# Patient Record
Sex: Male | Born: 1970 | Race: White | Hispanic: No | Marital: Single | Smoking: Former smoker
Health system: Southern US, Community
[De-identification: ages and names within clinical notes are randomized; demographics above are authoritative.]

## PROBLEM LIST (undated history)

## (undated) DIAGNOSIS — Q7133 Congenital absence of hand and finger, bilateral: Secondary | ICD-10-CM

## (undated) DIAGNOSIS — T7840XA Allergy, unspecified, initial encounter: Secondary | ICD-10-CM

## (undated) DIAGNOSIS — M792 Neuralgia and neuritis, unspecified: Secondary | ICD-10-CM

## (undated) DIAGNOSIS — J302 Other seasonal allergic rhinitis: Secondary | ICD-10-CM

## (undated) DIAGNOSIS — E785 Hyperlipidemia, unspecified: Secondary | ICD-10-CM

## (undated) DIAGNOSIS — Q7163 Lobster-claw hand, bilateral: Secondary | ICD-10-CM

## (undated) HISTORY — DX: Other seasonal allergic rhinitis: J30.2

## (undated) HISTORY — DX: Hyperlipidemia, unspecified: E78.5

## (undated) HISTORY — PX: CERVICAL DISC SURGERY: SHX588

## (undated) HISTORY — DX: Lobster-claw hand, bilateral: Q71.63

## (undated) HISTORY — DX: Neuralgia and neuritis, unspecified: M79.2

## (undated) HISTORY — DX: Congenital absence of hand and finger, bilateral: Q71.33

## (undated) HISTORY — DX: Allergy, unspecified, initial encounter: T78.40XA

---

## 2014-07-04 ENCOUNTER — Ambulatory Visit (INDEPENDENT_AMBULATORY_CARE_PROVIDER_SITE_OTHER): Payer: PRIVATE HEALTH INSURANCE | Admitting: Neurology

## 2014-07-04 ENCOUNTER — Encounter: Payer: Self-pay | Admitting: Neurology

## 2014-07-04 VITALS — BP 128/84 | HR 88 | Ht 72.0 in | Wt 222.1 lb

## 2014-07-04 DIAGNOSIS — R258 Other abnormal involuntary movements: Secondary | ICD-10-CM | POA: Diagnosis not present

## 2014-07-04 DIAGNOSIS — R292 Abnormal reflex: Secondary | ICD-10-CM | POA: Diagnosis not present

## 2014-07-04 DIAGNOSIS — G959 Disease of spinal cord, unspecified: Secondary | ICD-10-CM | POA: Insufficient documentation

## 2014-07-04 DIAGNOSIS — M62838 Other muscle spasm: Secondary | ICD-10-CM

## 2014-07-04 MED ORDER — BACLOFEN 10 MG PO TABS: 10.0000 mg | ORAL_TABLET | Freq: Three times a day (TID) | ORAL | Status: DC

## 2014-07-04 NOTE — Progress Notes (Signed)
Note faxed.
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
## 2014-07-04 NOTE — Patient Instructions (Addendum)
1.  Start taking Baclofen 10 mg tablets for your muscle spasms and cramps     Morning       Afternoon        Evening   Day 1-5 1/2 tab                                1/2 tab               Day 6-10 1/2 tab         1/2 tab            1/2 tab               Day 11-15 1/2 tab         1/2 tab            1 tab                 Day 16-20 1 tab            1/2 tab            1 tab                 Continue 1 tab             1 tab              1 tab        2.  MRI of the cervical and thoracic spine wwo contrast  3.  We will be in touch regarding the results of your imaging and determine the next steps from there

## 2014-07-12 ENCOUNTER — Ambulatory Visit
Admission: RE | Admit: 2014-07-12 | Discharge: 2014-07-12 | Disposition: A | Discharge status: Home / Self Care | Payer: 59 | Source: Ambulatory Visit | Attending: Neurology | Admitting: Neurology

## 2014-07-12 ENCOUNTER — Telehealth: Payer: Self-pay | Admitting: Neurology

## 2014-07-12 DIAGNOSIS — R258 Other abnormal involuntary movements: Secondary | ICD-10-CM

## 2014-07-12 DIAGNOSIS — G959 Disease of spinal cord, unspecified: Secondary | ICD-10-CM

## 2014-07-12 DIAGNOSIS — R292 Abnormal reflex: Secondary | ICD-10-CM

## 2014-07-12 DIAGNOSIS — M62838 Other muscle spasm: Secondary | ICD-10-CM

## 2014-07-12 MED ORDER — GADOBENATE DIMEGLUMINE 529 MG/ML IV SOLN
20.0000 mL | Freq: Once | INTRAVENOUS | Status: AC | PRN
  Administered 2014-07-12: 20 mL via INTRAVENOUS
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
## 2014-07-12 NOTE — Telephone Encounter (Signed)
Referral faxed

## 2014-07-16 ENCOUNTER — Telehealth: Payer: Self-pay | Admitting: Neurology

## 2014-07-16 NOTE — Telephone Encounter (Signed)
Great.  Thanks

## 2014-07-16 NOTE — Telephone Encounter (Signed)
[facility] called back regarding patient's appt. Pt has bene rescheduled for this afternoon at 530pm with Dr. Sheryle Spray. Pt is aware of appt / Sherri S.

## 2014-07-16 NOTE — Telephone Encounter (Signed)
FYI

## 2016-03-16 ENCOUNTER — Encounter: Payer: Self-pay | Admitting: Physician Assistant

## 2016-03-16 ENCOUNTER — Ambulatory Visit (INDEPENDENT_AMBULATORY_CARE_PROVIDER_SITE_OTHER): Payer: BC Managed Care – PPO | Admitting: Physician Assistant

## 2016-03-16 VITALS — BP 138/84 | HR 87 | Temp 97.1°F | Ht 72.0 in | Wt 231.4 lb

## 2016-03-16 DIAGNOSIS — Z111 Encounter for screening for respiratory tuberculosis: Secondary | ICD-10-CM

## 2016-03-16 DIAGNOSIS — Q7133 Congenital absence of hand and finger, bilateral: Secondary | ICD-10-CM | POA: Diagnosis not present

## 2016-03-16 DIAGNOSIS — Z23 Encounter for immunization: Secondary | ICD-10-CM | POA: Diagnosis not present

## 2016-03-16 DIAGNOSIS — Z6831 Body mass index (BMI) 31.0-31.9, adult: Secondary | ICD-10-CM | POA: Diagnosis not present

## 2016-03-16 DIAGNOSIS — Q7163 Lobster-claw hand, bilateral: Secondary | ICD-10-CM | POA: Insufficient documentation

## 2016-03-16 DIAGNOSIS — G250 Essential tremor: Secondary | ICD-10-CM

## 2016-03-17 ENCOUNTER — Encounter: Payer: Self-pay | Admitting: Physician Assistant

## 2016-03-17 NOTE — Patient Instructions (Signed)
Essential Tremor A tremor is trembling or shaking that you cannot control. Most tremors affect the hands or arms. Tremors can also affect the head, vocal cords, face, and other parts of the body.  Essential tremor is a tremor without a known cause.  CAUSES Essential tremor has no known cause.  RISK FACTORS You may be at greater risk of essential tremor if:   You have a family member with essential tremor.   You are age 45 or older.   You take certain medicines. SIGNS AND SYMPTOMS The main sign of a tremor is uncontrolled and unintentional rhythmic shaking of a body part.  You may have difficulty eating with a spoon or fork.   You may have difficulty writing.   You may nod your head up and down or side to side.   You may have a quivering voice.  Your tremors:  May get worse over time.   May come and go.   May be more noticeable on one side of your body.   May get worse due to stress, fatigue, caffeine, and extreme heat or cold.  DIAGNOSIS Your health care provider can diagnose essential tremor based on your symptoms, medical history, and a physical examination. There is no single test to diagnose an essential tremor. However, your health care provider may perform a variety of tests to rule out other conditions. Tests may include:   Blood and urine tests.   Imaging studies of your brain, such as:   CT scan.   MRI.   A test that measures involuntary muscle movement (electromyogram). TREATMENT Your tremors may go away without treatment. Mild tremors may not need treatment if they do not affect your day-to-day life. Severe tremors may need to be treated using one or a combination of the following options:   Medicines. This may include medicine that is injected.  Lifestyle changes.   Physical therapy.  HOME CARE INSTRUCTIONS  Take medicines only as directed by your health care provider.   Limit alcohol intake to no more than 1 drink per day for  nonpregnant women and 2 drinks per day for men. One drink equals 12 oz of beer, 5 oz of wine, or 1 oz of hard liquor.  Do not use any tobacco products, including cigarettes, chewing tobacco, or electronic cigarettes. If you need help quitting, ask your health care provider.  Take medicines only as directed by your health care provider.   Avoid extreme heat or cold.   Limit the amount of caffeine you consumeas directed by your health care provider.   Try to get eight hours of sleep each night.  Find ways to manage your stress, such as meditation or yoga.  Keep all follow-up visits as directed by your health care provider. This is important. This includes any physical therapy visits. SEEK MEDICAL CARE IF:  You experience any changes in the location or intensity of your tremors.   You start having a tremor after starting a new medicine.   You have tremor with other symptoms such as:   Numbness.   Tingling.   Pain.   Weakness.   Your tremor gets worse.   Your tremor interferes with your daily life.    This information is not intended to replace advice given to you by your health care provider. Make sure you discuss any questions you have with your health care provider.   Document Released: 05/25/2014 Document Reviewed: 05/25/2014 Elsevier Interactive Patient Education 2016 Elsevier Inc.  

## 2016-03-17 NOTE — Progress Notes (Signed)
BP 138/84   Pulse 87   Temp 97.1 F (36.2 C) (Oral)   Ht 6' (1.829 m)   Wt 231 lb 6.4 oz (105 kg)   BMI 31.38 kg/m    Subjective:    Patient ID: Jeffrey Lopez, male    DOB: 1970-08-11, 45 y.o.   MRN: [id number]  Jeffrey Lopez is a 45 y.o. male presenting on 03/16/2016 for Tremors  HPI Patient here to be established as new patient at [facility].  This patient is known to me from [facility]. He has continued to have some mild amount of tremor even on the gabapentin. He is tolerating it very well. He is taking 300 mg at bedtime. He also needs a form completed for work with [employer]. 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 He is excited about this opportunity. All his medications are reviewed today. He has had a well exam performed this year.  Past Medical History:  Diagnosis Date  . Ectrodactyly of both hands    Congenital hand deformity   Relevant past medical, surgical, family and social history reviewed and updated as indicated. Interim medical history since our last visit reviewed. Allergies and medications reviewed and updated.   Data reviewed from any sources in EPIC.  Review of Systems  Constitutional: Negative.  Negative for appetite change and fatigue.  HENT: Negative.   Eyes: Negative.  Negative for pain and visual disturbance.  Respiratory: Negative.  Negative for cough, chest tightness, shortness of breath and wheezing.   Cardiovascular: Negative.  Negative for chest pain, palpitations and leg swelling.  Gastrointestinal: Negative.  Negative for abdominal pain, diarrhea, nausea and vomiting.  Endocrine: Negative.   Genitourinary: Negative.   Musculoskeletal: Negative.   Skin: Negative.  Negative for color change and rash.  Neurological: Positive for tremors. Negative for seizures, syncope, weakness, numbness and headaches.    Psychiatric/Behavioral: Negative.      Social History   Social History  . Marital status: Single    Spouse name: N/A  . Number of children: N/A  . Years of education: N/A   Occupational History  . Not on file.   Social History Main Topics  . Smoking status: Former Smoker    Packs/day: 1.00    Years: 20.00    Types: Cigarettes    Quit date: 07/04/2013  . Smokeless tobacco: Former Systems developer     Comment: on the patch  . Alcohol use 0.0 oz/week     Comment: Rarely  . Drug use: No  . Sexual activity: Not on file   Other Topics Concern  . Not on file   Social History Narrative   Lives with daughter in a 2 story home.     Works with a IT consultant.     Education: vocational school.     Past Surgical History:  Procedure Laterality Date  . none      Family History  Problem Relation Age of Onset  . Asthma Mother     Deceased, 50  . Heart attack Father     Deceased, 29  . Diabetes Paternal Aunt   . Healthy Sister   . Healthy Daughter       Medication List       Accurate as of 03/16/16 11:59 PM. Always use your most recent med list.  gabapentin 300 MG capsule Commonly known as:  NEURONTIN Take 300 mg by mouth daily.          Objective:    BP 138/84   Pulse 87   Temp 97.1 F (36.2 C) (Oral)   Ht 6' (1.829 m)   Wt 231 lb 6.4 oz (105 kg)   BMI 31.38 kg/m   No Known Allergies Wt Readings from Last 3 Encounters:  03/16/16 231 lb 6.4 oz (105 kg)  07/12/14 220 lb (99.8 kg)  07/12/14 220 lb (99.8 kg)    Physical Exam  Constitutional: He appears well-developed and well-nourished.  HENT:  Head: Normocephalic and atraumatic.  Eyes: Conjunctivae and EOM are normal. Pupils are equal, round, and reactive to light.  Neck: Normal range of motion. Neck supple.  Cardiovascular: Normal rate, regular rhythm and normal heart sounds.   Pulmonary/Chest: Effort normal and breath sounds normal.  Abdominal: Soft. Bowel sounds are normal.   Musculoskeletal: Normal range of motion.       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 He has good muscle tone in his hands. Good circulation skin and joints appear normal  Skin: Skin is warm and dry.       Assessment & Plan:   1. Tremor, essential - gabapentin (NEURONTIN) 300 MG capsule; Take 300 mg by mouth daily.  2. Ectrodactyly of both hands  3. Body mass index 31.0-31.9, adult  4. Screening-pulmonary TB - Quantiferon tb gold assay (blood)  5. Encounter for immunization - Flu vaccine, recombinat, quadrivalent, inj   Continue all other maintenance medications as listed above. Educational handout given for tremor  Follow up plan: Return if symptoms worsen or fail to improve.  Terald Sleeper PA-C [facility] [address]  [place], [place] [address] [phone]   03/17/2016, 2:20 PM

## 2016-03-18 LAB — QUANTIFERON IN TUBE
QUANTIFERON MITOGEN VALUE: 7.55 IU/mL
QUANTIFERON NIL VALUE: 0.02 [IU]/mL
QUANTIFERON TB AG VALUE: 0.01 [IU]/mL
QUANTIFERON TB GOLD: NEGATIVE

## 2016-03-18 LAB — QUANTIFERON TB GOLD ASSAY (BLOOD)

## 2016-07-28 ENCOUNTER — Ambulatory Visit (INDEPENDENT_AMBULATORY_CARE_PROVIDER_SITE_OTHER): Payer: BC Managed Care – PPO

## 2016-07-28 ENCOUNTER — Encounter: Payer: Self-pay | Admitting: Family Medicine

## 2016-07-28 ENCOUNTER — Ambulatory Visit (INDEPENDENT_AMBULATORY_CARE_PROVIDER_SITE_OTHER): Payer: BC Managed Care – PPO | Admitting: Family Medicine

## 2016-07-28 VITALS — BP 120/79 | HR 91 | Temp 98.1°F | Ht 72.0 in | Wt 229.0 lb

## 2016-07-28 DIAGNOSIS — R059 Cough, unspecified: Secondary | ICD-10-CM

## 2016-07-28 DIAGNOSIS — R05 Cough: Secondary | ICD-10-CM | POA: Diagnosis not present

## 2016-07-28 MED ORDER — BETAMETHASONE SOD PHOS & ACET 6 (3-3) MG/ML IJ SUSP
6.0000 mg | Freq: Once | INTRAMUSCULAR | Status: AC
  Administered 2016-07-28: 6 mg via INTRAMUSCULAR

## 2016-07-28 MED ORDER — FEXOFENADINE HCL 180 MG PO TABS: 180.0000 mg | ORAL_TABLET | Freq: Every day | ORAL | 11 refills | Status: DC

## 2016-07-28 NOTE — Progress Notes (Addendum)
Subjective:  Patient ID: Jeffrey Lopez, male    DOB: 1971/01/14  Age: 46 y.o. MRN: [id number]  CC: Cough   HPI Jeffrey Lopez presents for Patient presents with upper respiratory congestion. Rhinorrhea that is frequently purulent. There is moderate sore throat. Patient reports coughing frequently as well.  No sputum noted. There is no fever, chills or sweats. The patient denies being short of breath. Onset was 7 days ago. Gradually worsening. Tried OTCs without improvement. Occurs anually in the Spring. 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    History Jeffrey Lopez has a past medical history of Ectrodactyly of both hands.   Jeffrey Lopez has a past surgical history that includes none.   His family history includes Asthma in his mother; Diabetes in his paternal aunt; Healthy in his daughter and sister; Heart attack in his father.Jeffrey Lopez reports that Jeffrey Lopez quit smoking about 3 years ago. His smoking use included Cigarettes. Jeffrey Lopez has a 20.00 pack-year smoking history. Jeffrey Lopez has quit using smokeless tobacco. Jeffrey Lopez reports that Jeffrey Lopez drinks alcohol. Jeffrey Lopez reports that Jeffrey Lopez does not use drugs.    ROS Review of Systems  Constitutional: Negative for activity change, appetite change, chills and fever.  HENT: Positive for congestion, postnasal drip, rhinorrhea and sinus pressure. Negative for ear discharge, ear pain, hearing loss, nosebleeds, sneezing and trouble swallowing.   Respiratory: Negative for chest tightness and shortness of breath.   Cardiovascular: Negative for chest pain and palpitations.  Skin: Negative for rash.    Objective:  BP 120/79   Pulse 91   Temp 98.1 F (36.7 C) (Oral)   Ht 6' (1.829 m)   Wt 229 lb (103.9 kg)   BMI 31.06 kg/m   BP Readings from Last 3 Encounters:  07/28/16 120/79  03/16/16 138/84  07/04/14 128/84    Wt Readings from Last 3 Encounters:  07/28/16 229 lb (103.9 kg)  03/16/16 231 lb 6.4 oz (105 kg)  07/12/14 220 lb (99.8 kg)     Physical  Exam  Constitutional: Jeffrey Lopez appears well-developed and well-nourished.  HENT:  Head: Normocephalic and atraumatic.  Right Ear: Tympanic membrane and external ear normal. No decreased hearing is noted.  Left Ear: Tympanic membrane and external ear normal. No decreased hearing is noted.  Nose: Mucosal edema present. Right sinus exhibits no frontal sinus tenderness. Left sinus exhibits no frontal sinus tenderness.  Mouth/Throat: No oropharyngeal exudate or posterior oropharyngeal erythema.  Neck: No Brudzinski's sign noted.  Pulmonary/Chest: Breath sounds normal. No respiratory distress.  Lymphadenopathy:       Head (right side): No preauricular adenopathy present.       Head (left side): No preauricular adenopathy present.       Right cervical: No superficial cervical adenopathy present.      Left cervical: No superficial cervical adenopathy present.      Assessment & Plan:   Jeffrey Lopez was seen today for cough.  Diagnoses and all orders for this visit:  Cough -     DG Chest 2 View; Future -     betamethasone acetate-betamethasone sodium phosphate (CELESTONE) injection 6 mg; Inject 1 mL (6 mg total) into the muscle once.  Other orders -     fexofenadine (ALLEGRA) 180 MG tablet; Take 1 tablet (180 mg total) by mouth daily. For allergy symptoms     I am having Jeffrey Lopez start on fexofenadine. I am also having him maintain his gabapentin. We administered betamethasone acetate-betamethasone sodium phosphate.  Allergies as of  07/28/2016   No Known Allergies     Medication List       Accurate as of 07/28/16 11:59 PM. Always use your most recent med list.          fexofenadine 180 MG tablet Commonly known as:  ALLEGRA Take 1 tablet (180 mg total) by mouth daily. For allergy symptoms   gabapentin 300 MG capsule Commonly known as:  NEURONTIN Take 300 mg by mouth daily.       Follow-up: Return in about 2 weeks (around 08/11/2016).  Claretta Fraise, M.D.

## 2016-12-30 ENCOUNTER — Encounter: Payer: Self-pay | Admitting: Physician Assistant

## 2016-12-30 ENCOUNTER — Ambulatory Visit (INDEPENDENT_AMBULATORY_CARE_PROVIDER_SITE_OTHER): Payer: BC Managed Care – PPO | Admitting: Physician Assistant

## 2016-12-30 VITALS — BP 120/80 | HR 86 | Temp 97.1°F | Ht 72.0 in | Wt 228.0 lb

## 2016-12-30 DIAGNOSIS — G250 Essential tremor: Secondary | ICD-10-CM | POA: Diagnosis not present

## 2016-12-30 DIAGNOSIS — M21962 Unspecified acquired deformity of left lower leg: Secondary | ICD-10-CM | POA: Diagnosis not present

## 2016-12-30 DIAGNOSIS — Z0000 Encounter for general adult medical examination without abnormal findings: Secondary | ICD-10-CM

## 2016-12-30 MED ORDER — FEXOFENADINE HCL 180 MG PO TABS: 180.0000 mg | ORAL_TABLET | Freq: Every day | ORAL | 11 refills | Status: DC

## 2016-12-30 MED ORDER — GABAPENTIN 300 MG PO CAPS: 300.0000 mg | ORAL_CAPSULE | Freq: Every day | ORAL | 3 refills | Status: DC

## 2016-12-30 NOTE — Patient Instructions (Signed)
In a few days you may receive a survey in the mail or online from [employer] regarding your visit with us today. Please take a moment to fill this out. Your feedback is very important to our whole office. It can help us better understand your needs as well as improve your experience and satisfaction. Thank you for taking your time to complete it. We care about you.  Keri Veale, PA-C  
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
## 2016-12-31 LAB — CBC WITH DIFFERENTIAL/PLATELET
BASOS: 1 %
Basophils Absolute: 0.1 10*3/uL (ref 0.0–0.2)
EOS (ABSOLUTE): 0.3 10*3/uL (ref 0.0–0.4)
EOS: 3 %
HEMATOCRIT: 46.1 % (ref 37.5–51.0)
Hemoglobin: 15.8 g/dL (ref 13.0–17.7)
IMMATURE GRANULOCYTES: 1 %
Immature Grans (Abs): 0.1 10*3/uL (ref 0.0–0.1)
LYMPHS ABS: 2.6 10*3/uL (ref 0.7–3.1)
Lymphs: 21 %
MCH: 30.6 pg (ref 26.6–33.0)
MCHC: 34.3 g/dL (ref 31.5–35.7)
MCV: 89 fL (ref 79–97)
MONOS ABS: 0.9 10*3/uL (ref 0.1–0.9)
Monocytes: 8 %
Neutrophils Absolute: 8.4 10*3/uL — ABNORMAL HIGH (ref 1.4–7.0)
Neutrophils: 66 %
Platelets: 308 10*3/uL (ref 150–379)
RBC: 5.17 x10E6/uL (ref 4.14–5.80)
RDW: 13.3 % (ref 12.3–15.4)
WBC: 12.4 10*3/uL — ABNORMAL HIGH (ref 3.4–10.8)

## 2016-12-31 LAB — CMP14+EGFR
ALT: 24 IU/L (ref 0–44)
AST: 15 IU/L (ref 0–40)
Albumin/Globulin Ratio: 2.1 (ref 1.2–2.2)
Albumin: 4.6 g/dL (ref 3.5–5.5)
Alkaline Phosphatase: 110 IU/L (ref 39–117)
BUN/Creatinine Ratio: 11 (ref 9–20)
BUN: 11 mg/dL (ref 6–24)
Bilirubin Total: <0.2 mg/dL (ref 0.0–1.2)
CALCIUM: 9.1 mg/dL (ref 8.7–10.2)
CO2: 24 mmol/L (ref 20–29)
Chloride: 103 mmol/L (ref 96–106)
Creatinine, Ser: 1.02 mg/dL (ref 0.76–1.27)
GFR calc Af Amer: 102 mL/min/{1.73_m2} (ref >59)
GFR, EST NON AFRICAN AMERICAN: 88 mL/min/{1.73_m2} (ref >59)
GLOBULIN, TOTAL: 2.2 g/dL (ref 1.5–4.5)
Glucose: 91 mg/dL (ref 65–99)
Potassium: 4 mmol/L (ref 3.5–5.2)
Sodium: 143 mmol/L (ref 134–144)
Total Protein: 6.8 g/dL (ref 6.0–8.5)

## 2016-12-31 LAB — LIPID PANEL
CHOLESTEROL TOTAL: 219 mg/dL — AB (ref 100–199)
Chol/HDL Ratio: 5.9 ratio — ABNORMAL HIGH (ref 0.0–5.0)
HDL: 37 mg/dL — ABNORMAL LOW (ref >39)
LDL Calculated: 146 mg/dL — ABNORMAL HIGH (ref 0–99)
Triglycerides: 179 mg/dL — ABNORMAL HIGH (ref 0–149)
VLDL CHOLESTEROL CAL: 36 mg/dL (ref 5–40)

## 2017-01-11 ENCOUNTER — Telehealth: Payer: Self-pay | Admitting: Physician Assistant

## 2017-01-11 MED ORDER — ATORVASTATIN CALCIUM 10 MG PO TABS: 10.0000 mg | ORAL_TABLET | Freq: Every day | ORAL | 3 refills | Status: DC

## 2017-01-11 NOTE — Telephone Encounter (Signed)
Please review and advise.

## 2017-01-11 NOTE — Telephone Encounter (Signed)
Patient changed his mind after he got the results from his labs today. He would like something called into [facility] in [place] for his cholesterol. Please advise.

## 2017-01-11 NOTE — Telephone Encounter (Signed)
Defer to PCP  Laroy Apple, MD [facility] 01/11/2017, 5:00 PM

## 2017-01-12 NOTE — Telephone Encounter (Signed)
The atorvastatin was sent

## 2017-01-12 NOTE — Telephone Encounter (Signed)
Detailed message left for patient.

## 2017-05-11 IMAGING — DX DG CHEST 2V
2 series · 2 of 2 positions shown · non-contrast
Comparison: None.

CLINICAL DATA: Cough and congestion for 1 week

EXAM:
CHEST  2 VIEW

[chest pa]
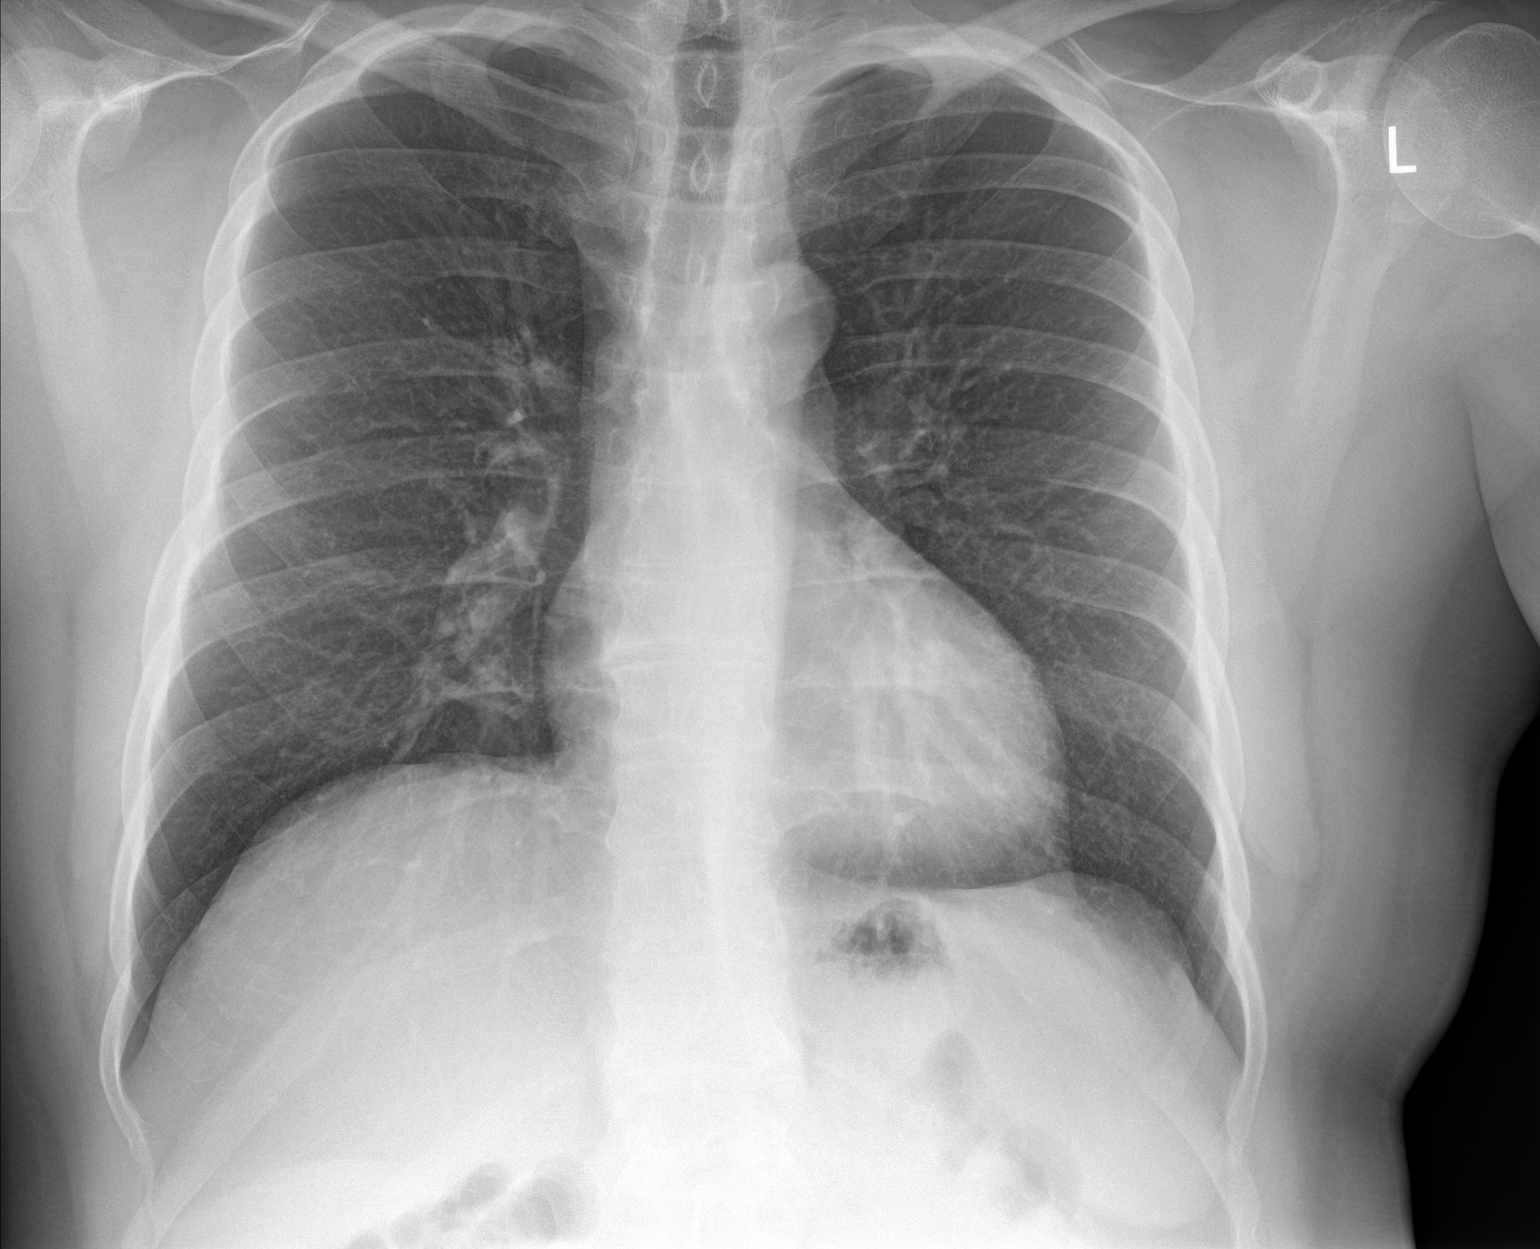

[chest lat]
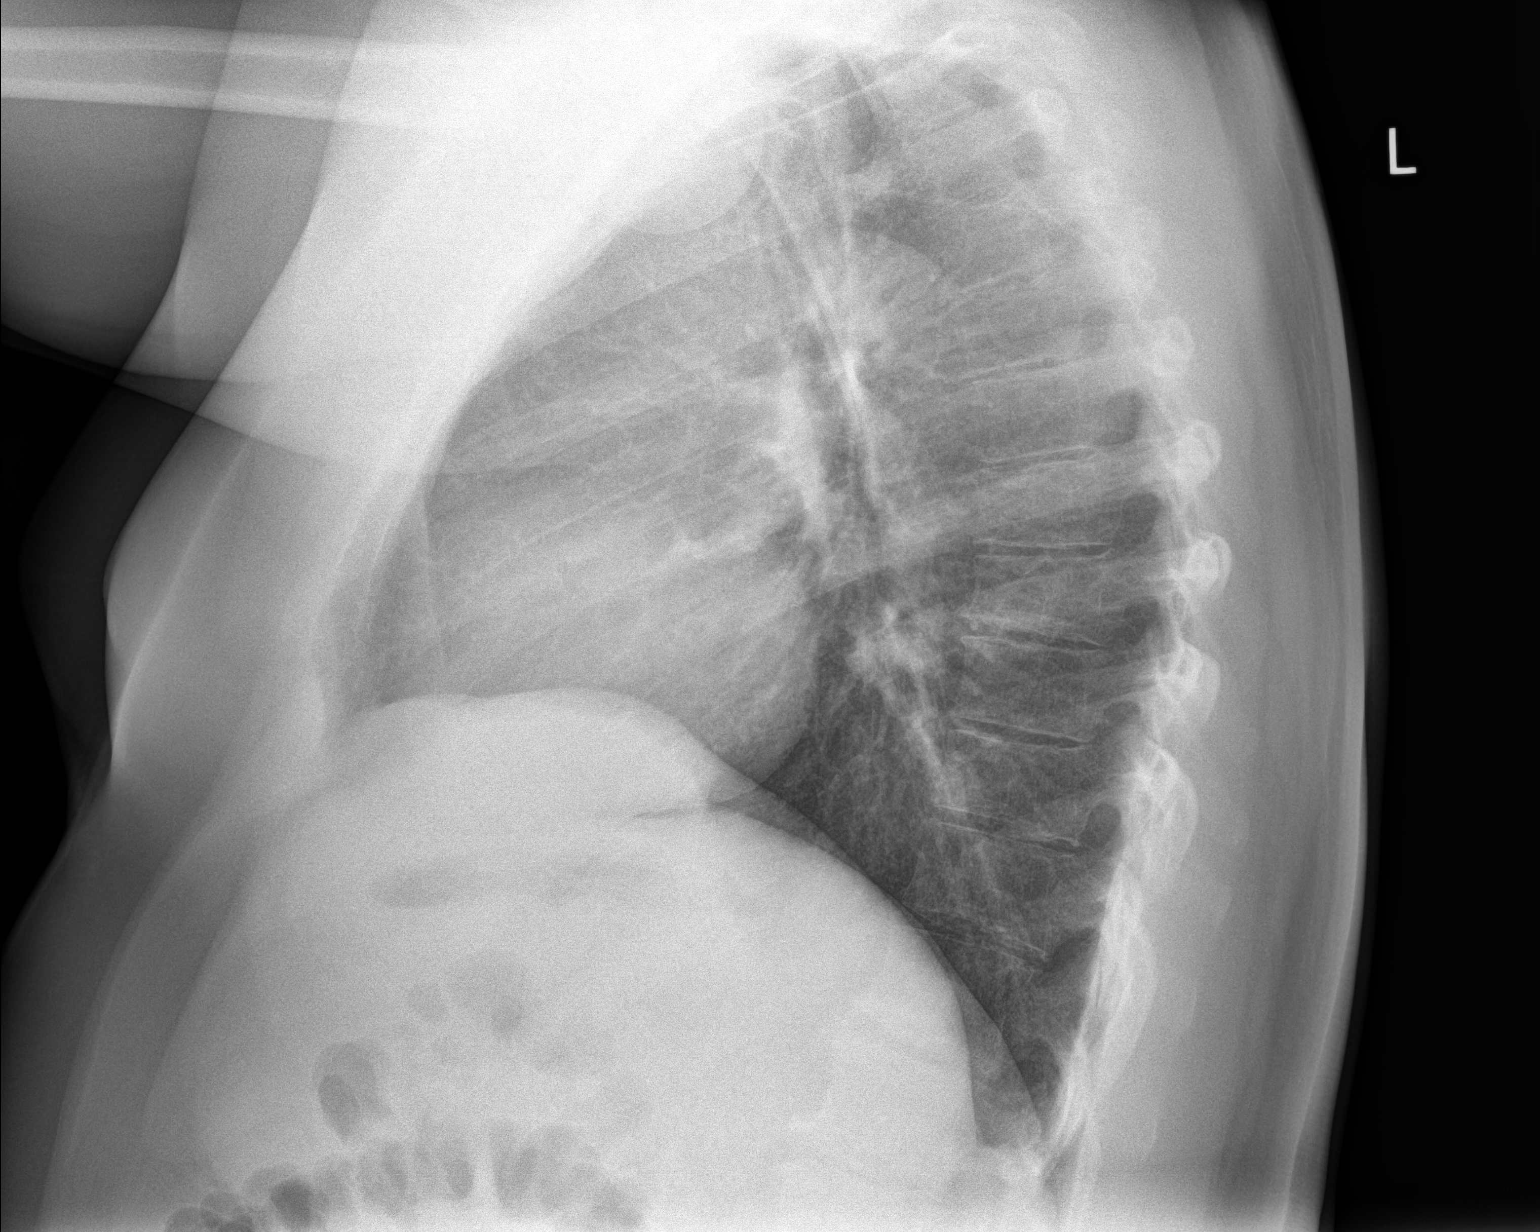

[2 of 2 positions shown; findings below may reference images not displayed]

FINDINGS: Lungs are clear. Heart size and pulmonary vascularity are normal. No
adenopathy. No pneumothorax. No bone lesions.
IMPRESSION: No edema or consolidation.

## 2017-08-03 ENCOUNTER — Ambulatory Visit (INDEPENDENT_AMBULATORY_CARE_PROVIDER_SITE_OTHER): Payer: BC Managed Care – PPO | Admitting: *Deleted

## 2017-08-03 ENCOUNTER — Ambulatory Visit: Payer: BC Managed Care – PPO

## 2017-08-03 DIAGNOSIS — Z23 Encounter for immunization: Secondary | ICD-10-CM

## 2018-02-09 ENCOUNTER — Other Ambulatory Visit: Payer: Self-pay | Admitting: Physician Assistant

## 2018-02-09 DIAGNOSIS — G250 Essential tremor: Secondary | ICD-10-CM

## 2018-02-10 ENCOUNTER — Other Ambulatory Visit: Payer: Self-pay | Admitting: *Deleted

## 2018-02-10 ENCOUNTER — Other Ambulatory Visit: Payer: Self-pay | Admitting: Physician Assistant

## 2018-02-10 DIAGNOSIS — G250 Essential tremor: Secondary | ICD-10-CM

## 2018-02-10 MED ORDER — GABAPENTIN 300 MG PO CAPS: 300.0000 mg | ORAL_CAPSULE | Freq: Every day | ORAL | 0 refills | Status: DC

## 2018-02-10 MED ORDER — ATORVASTATIN CALCIUM 10 MG PO TABS: 10.0000 mg | ORAL_TABLET | Freq: Every day | ORAL | 0 refills | Status: DC

## 2018-02-10 MED ORDER — FEXOFENADINE HCL 180 MG PO TABS: 180.0000 mg | ORAL_TABLET | Freq: Every day | ORAL | 0 refills | Status: DC

## 2018-02-10 NOTE — Telephone Encounter (Signed)
LM to call for appt

## 2018-02-10 NOTE — Telephone Encounter (Signed)
LM for pt to call and set up [id number]

## 2018-02-10 NOTE — Telephone Encounter (Signed)
Last seen 12/20/16  [facility]  Needs to be seen

## 2018-02-10 NOTE — Telephone Encounter (Signed)
appt made = #30 sent on all 3 meds

## 2018-02-10 NOTE — Telephone Encounter (Signed)
Last seen and last lipid 12/30/16  Jeffrey Lopez  Needs to be seen

## 2018-03-08 ENCOUNTER — Encounter: Payer: Self-pay | Admitting: Physician Assistant

## 2018-03-08 ENCOUNTER — Ambulatory Visit: Payer: BC Managed Care – PPO | Admitting: Physician Assistant

## 2018-03-08 VITALS — BP 125/81 | HR 73 | Temp 97.8°F | Ht 72.0 in | Wt 230.8 lb

## 2018-03-08 DIAGNOSIS — Z0001 Encounter for general adult medical examination with abnormal findings: Secondary | ICD-10-CM | POA: Diagnosis not present

## 2018-03-08 DIAGNOSIS — Z23 Encounter for immunization: Secondary | ICD-10-CM

## 2018-03-08 DIAGNOSIS — G250 Essential tremor: Secondary | ICD-10-CM | POA: Diagnosis not present

## 2018-03-08 DIAGNOSIS — Z0000 Encounter for general adult medical examination without abnormal findings: Secondary | ICD-10-CM

## 2018-03-08 LAB — CBC WITH DIFFERENTIAL/PLATELET
BASOS ABS: 0.1 10*3/uL (ref 0.0–0.2)
Basos: 1 %
EOS (ABSOLUTE): 0.3 10*3/uL (ref 0.0–0.4)
Eos: 3 %
Hematocrit: 47.4 % (ref 37.5–51.0)
Hemoglobin: 16 g/dL (ref 13.0–17.7)
IMMATURE GRANS (ABS): 0.1 10*3/uL (ref 0.0–0.1)
IMMATURE GRANULOCYTES: 1 %
LYMPHS: 16 %
Lymphocytes Absolute: 1.7 10*3/uL (ref 0.7–3.1)
MCH: 29.7 pg (ref 26.6–33.0)
MCHC: 33.8 g/dL (ref 31.5–35.7)
MCV: 88 fL (ref 79–97)
MONOS ABS: 0.7 10*3/uL (ref 0.1–0.9)
Monocytes: 6 %
NEUTROS PCT: 73 %
Neutrophils Absolute: 7.8 10*3/uL — ABNORMAL HIGH (ref 1.4–7.0)
PLATELETS: 315 10*3/uL (ref 150–450)
RBC: 5.38 x10E6/uL (ref 4.14–5.80)
RDW: 12.1 % — ABNORMAL LOW (ref 12.3–15.4)
WBC: 10.7 10*3/uL (ref 3.4–10.8)

## 2018-03-08 LAB — CMP14+EGFR
A/G RATIO: 1.9 (ref 1.2–2.2)
ALT: 36 IU/L (ref 0–44)
AST: 21 IU/L (ref 0–40)
Albumin: 4.1 g/dL (ref 3.5–5.5)
Alkaline Phosphatase: 123 IU/L — ABNORMAL HIGH (ref 39–117)
BUN/Creatinine Ratio: 9 (ref 9–20)
BUN: 9 mg/dL (ref 6–24)
Bilirubin Total: 0.4 mg/dL (ref 0.0–1.2)
CO2: 24 mmol/L (ref 20–29)
Calcium: 9 mg/dL (ref 8.7–10.2)
Chloride: 102 mmol/L (ref 96–106)
Creatinine, Ser: 1.02 mg/dL (ref 0.76–1.27)
GFR calc Af Amer: 101 mL/min/{1.73_m2} (ref >59)
GFR calc non Af Amer: 88 mL/min/{1.73_m2} (ref >59)
GLUCOSE: 98 mg/dL (ref 65–99)
Globulin, Total: 2.2 g/dL (ref 1.5–4.5)
Potassium: 4.4 mmol/L (ref 3.5–5.2)
Sodium: 142 mmol/L (ref 134–144)
TOTAL PROTEIN: 6.3 g/dL (ref 6.0–8.5)

## 2018-03-08 LAB — LIPID PANEL
CHOL/HDL RATIO: 4.2 ratio (ref 0.0–5.0)
Cholesterol, Total: 160 mg/dL (ref 100–199)
HDL: 38 mg/dL — AB (ref >39)
LDL Calculated: 101 mg/dL — ABNORMAL HIGH (ref 0–99)
TRIGLYCERIDES: 107 mg/dL (ref 0–149)
VLDL CHOLESTEROL CAL: 21 mg/dL (ref 5–40)

## 2018-03-08 MED ORDER — GABAPENTIN 300 MG PO CAPS: 300.0000 mg | ORAL_CAPSULE | Freq: Three times a day (TID) | ORAL | 11 refills | Status: DC

## 2018-03-08 MED ORDER — ATORVASTATIN CALCIUM 10 MG PO TABS: 10.0000 mg | ORAL_TABLET | Freq: Every day | ORAL | 11 refills | Status: DC

## 2018-03-08 MED ORDER — FEXOFENADINE HCL 180 MG PO TABS: 180.0000 mg | ORAL_TABLET | Freq: Every day | ORAL | 11 refills | Status: DC

## 2018-03-09 ENCOUNTER — Encounter: Payer: Self-pay | Admitting: Physician Assistant
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
## 2018-04-12 ENCOUNTER — Other Ambulatory Visit: Payer: Self-pay | Admitting: Physician Assistant

## 2018-04-12 DIAGNOSIS — G250 Essential tremor: Secondary | ICD-10-CM

## 2018-06-17 ENCOUNTER — Other Ambulatory Visit: Payer: Self-pay | Admitting: Physician Assistant

## 2018-06-17 DIAGNOSIS — G250 Essential tremor: Secondary | ICD-10-CM

## 2018-06-21 ENCOUNTER — Other Ambulatory Visit: Payer: Self-pay | Admitting: Physician Assistant

## 2018-06-21 DIAGNOSIS — G250 Essential tremor: Secondary | ICD-10-CM

## 2018-07-14 ENCOUNTER — Ambulatory Visit: Payer: BC Managed Care – PPO | Admitting: Nurse Practitioner

## 2018-07-14 ENCOUNTER — Encounter: Payer: Self-pay | Admitting: Gastroenterology

## 2018-07-14 ENCOUNTER — Encounter: Payer: Self-pay | Admitting: Nurse Practitioner

## 2018-07-14 VITALS — BP 136/81 | HR 80 | Temp 96.7°F | Ht 72.0 in | Wt 231.0 lb

## 2018-07-14 DIAGNOSIS — K649 Unspecified hemorrhoids: Secondary | ICD-10-CM

## 2018-07-14 DIAGNOSIS — K625 Hemorrhage of anus and rectum: Secondary | ICD-10-CM | POA: Diagnosis not present

## 2018-07-14 LAB — HEMOGLOBIN, FINGERSTICK: HEMOGLOBIN: 16 g/dL (ref 12.6–17.7)

## 2018-07-14 MED ORDER — HYDROCORTISONE ACETATE 25 MG RE SUPP: 25.0000 mg | Freq: Two times a day (BID) | RECTAL | 0 refills | Status: DC

## 2018-07-14 NOTE — Progress Notes (Signed)
   Subjective:    Patient ID: Jeffrey Lopez, male    DOB: 1970-10-16, 48 y.o.   MRN: [id number]   Chief Complaint: Blood In Stools (Blood in underwear and also dripping in to commode) and rectal pressure   HPI Patient comes in today stating that last week after having a bowel movement he had rectal pressure. The pressure resolved then he starting seeing blood in stool. Yesterday blood was actually dripping from rectum after bowel movement. It is bright red blood.    Review of Systems  Constitutional: Negative.   Respiratory: Negative.   Cardiovascular: Negative.   Gastrointestinal: Positive for blood in stool.  Neurological: Negative.   Psychiatric/Behavioral: Negative.   All other systems reviewed and are negative.      Objective:   Physical Exam Constitutional:      Appearance: He is normal weight.  Cardiovascular:     Rate and Rhythm: Normal rate and regular rhythm.     Heart sounds: Normal heart sounds.  Pulmonary:     Effort: Pulmonary effort is normal.     Breath sounds: Normal breath sounds.  Genitourinary:    Comments: Small palpable nonthrombosed hemorrhoids. Dry blood around anal area. Skin:    General: Skin is warm and dry.  Neurological:     General: No focal deficit present.     Mental Status: He is alert and oriented to person, place, and time.  Psychiatric:        Mood and Affect: Mood normal.        Behavior: Behavior normal.   BP 136/81   Pulse 80   Temp (!) 96.7 F (35.9 C) (Oral)   Ht 6' (1.829 m)   Wt 231 lb (104.8 kg)   BMI 31.33 kg/m        Assessment & Plan:  Jeffrey Lopez in today with chief complaint of Blood In Stools (Blood in underwear and also dripping in to commode) and rectal pressure   1. Hemorrhoids, unspecified hemorrhoid type stool softeners Increase fiber in diet - hydrocortisone (ANUSOL-HC) 25 MG suppository; Place 1 suppository (25 mg total) rectally 2 (two) times daily.  Dispense: 12 suppository; Refill: 0  2.  Rectal bleeding Continue to watch until sees GI - Hemoglobin, fingerstick - Ambulatory referral to Gastroenterology  [place], FNP

## 2018-07-14 NOTE — Patient Instructions (Signed)
Rectal Bleeding    Rectal bleeding is when blood comes out of the opening of the butt (anus). People with this kind of bleeding may notice bright red blood in their underwear or in the toilet after they poop (have a bowel movement). They may also have dark red or black poop (stool). Rectal bleeding is often a sign that something is wrong. It needs to be checked by a doctor.  Follow these instructions at home:  Watch for any changes in your condition. Take these actions to help with bleeding and discomfort:  Eat a diet that is high in fiber. This will keep your poop soft so it is easier for you to poop without pushing too hard. Ask your doctor to tell you what foods and drinks are high in fiber.  Drink enough fluid to keep your pee (urine) clear or pale yellow. This also helps keep your poop soft.  Try taking a warm bath. This may help with pain.  Keep all follow-up visits as told by your doctor. This is important.  Get help right away if:  You have new bleeding.  You have more bleeding than before.  You have black or dark red poop.  You throw up (vomit) blood or something that looks like coffee grounds.  You have pain or tenderness in your belly (abdomen).  You have a fever.  You feel weak.  You feel sick to your stomach (nauseous).  You pass out (faint).  You have very bad pain in your butt.  You cannot poop.  This information is not intended to replace advice given to you by your health care provider. Make sure you discuss any questions you have with your health care provider.  Document Released: 01/14/2011 Document Revised: 10/10/2015 Document Reviewed: 06/30/2015  Elsevier Interactive Patient Education  2019 [employer] Inc[employer]

## 2018-08-12 ENCOUNTER — Ambulatory Visit (INDEPENDENT_AMBULATORY_CARE_PROVIDER_SITE_OTHER): Payer: BC Managed Care – PPO | Admitting: Gastroenterology

## 2018-08-12 ENCOUNTER — Other Ambulatory Visit: Payer: Self-pay

## 2018-08-12 ENCOUNTER — Encounter: Payer: Self-pay | Admitting: Gastroenterology

## 2018-08-12 VITALS — BP 124/82 | HR 88 | Temp 98.2°F | Ht 72.0 in | Wt 230.0 lb

## 2018-08-12 DIAGNOSIS — K5909 Other constipation: Secondary | ICD-10-CM

## 2018-08-12 DIAGNOSIS — K625 Hemorrhage of anus and rectum: Secondary | ICD-10-CM

## 2018-08-12 MED ORDER — NA SULFATE-K SULFATE-MG SULF 17.5-3.13-1.6 GM/177ML PO SOLN: 1.0000 | Freq: Once | ORAL | 0 refills | Status: AC

## 2018-08-12 NOTE — Patient Instructions (Signed)
If you are age 48 or older, your body mass index should be between 23-30. Your Body mass index is 31.19 kg/m. If this is out of the aforementioned range listed, please consider follow up with your Primary Care Provider.  If you are age 49 or younger, your body mass index should be between 19-25. Your Body mass index is 31.19 kg/m. If this is out of the aformentioned range listed, please consider follow up with your Primary Care Provider.   You have been scheduled for a colonoscopy. Please follow written instructions given to you at your visit today.  Please pick up your prep supplies at the pharmacy within the next 1-3 days. If you use inhalers (even only as needed), please bring them with you on the day of your procedure. Your physician has requested that you go to www.startemmi.com and enter the access code given to you at your visit today. This web site gives a general overview about your procedure. However, you should still follow specific instructions given to you by our office regarding your preparation for the procedure.  To help prevent the possible spread of infection to our patients, communities, and staff; we will be implementing the following measures:  As of now we are not allowing any visitors/family members to accompany you to any upcoming appointments with [facility]. If you have any concerns about this please contact our office to discuss prior to the appointment.   It was a pleasure to see you today!  Dr. Loletha Carrow

## 2018-08-12 NOTE — Progress Notes (Signed)
[facility] Consult Note:  History: Jeffrey Lopez 08/12/2018  Referring physician: Terald Sleeper, PA-C  Reason for consult/chief complaint: Rectal Bleeding (has BRB, even with no BM. ) and Gastroesophageal Reflux (Heartburn intermittent )   Subjective  HPI:  He was seen by primary care on 07/14/2018 for some rectal pressure followed by rectal bleeding.  Primary care exam notes some dried perianal blood and "hemorrhoids".  I-STAT hemoglobin normal at 16  Since that time, he has started taking fiber regularly, says previous constipation resolved and there is no further bleeding.  It had initially occurred over several days but not happened since then.  He denies abdominal pain.  He has intermittent heartburn, denies dysphagia, odynophagia, nausea vomiting or early satiety or weight loss.  ROS:  Review of Systems  Constitutional: Negative for appetite change and unexpected weight change.  HENT: Negative for mouth sores and voice change.   Eyes: Negative for pain and redness.  Respiratory: Negative for cough and shortness of breath.   Cardiovascular: Negative for chest pain and palpitations.  Genitourinary: Negative for dysuria and hematuria.  Musculoskeletal: Negative for arthralgias and myalgias.  Skin: Negative for pallor and rash.  Neurological: Negative for weakness and headaches.  Hematological: Negative for adenopathy.     Past Medical History: Past Medical History:  Diagnosis Date  . 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 Hyperlipidemia   . Nerve pain   . Seasonal allergies      Past Surgical History: Past Surgical History:  Procedure Laterality Date  . CERVICAL DISC SURGERY       Family History: Family History  Problem Relation Age of Onset  . Asthma Mother        Deceased, 50  . Heart attack Father        Deceased, 45  . Diabetes Paternal Aunt   . Healthy Sister   . Healthy Daughter   . Colon cancer Neg  Hx   . Stomach cancer Neg Hx   . Pancreatic cancer Neg Hx     Social History: Social History   Socioeconomic History  . Marital status: Single    Spouse name: Not on file  . Number of children: Not on file  . Years of education: Not on file  . Highest education level: Not on file  Occupational History  . Not on file  Social Needs  . Financial resource strain: Not on file  . Food insecurity:    Worry: Not on file    Inability: Not on file  . Transportation needs:    Medical: Not on file    Non-medical: Not on file  Tobacco Use  . Smoking status: Former Smoker    Packs/day: 1.00    Years: 20.00    Pack years: 20.00    Types: Cigarettes    Last attempt to quit: 07/04/2013    Years since quitting: 5.1  . Smokeless tobacco: Former Systems developer  . Tobacco comment: on the patch  Substance and Sexual Activity  . Alcohol use: Yes    Alcohol/week: 0.0 standard drinks    Comment: Rarely  . Drug use: No  . Sexual activity: Not on file  Lifestyle  . Physical activity:    Days per week: Not on file    Minutes per session: Not on file  . Stress: Not on file  Relationships  . Social connections:    Talks on phone: Not on file    Gets together: Not  on file    Attends religious service: Not on file    Active member of club or organization: Not on file    Attends meetings of clubs or organizations: Not on file    Relationship status: Not on file  Other Topics Concern  . Not on file  Social History Narrative   Lives with daughter in a 2 story home.     Works with a IT consultant.     Education: vocational school.    Works at [employer], and has his own [place]: No Known Allergies  Outpatient Meds: Current Outpatient Medications  Medication Sig Dispense Refill  . atorvastatin (LIPITOR) 10 MG tablet Take 1 tablet (10 mg total) by mouth daily. 30 tablet 11  . fexofenadine (ALLEGRA) 180 MG tablet Take 1 tablet (180 mg total) by mouth  daily. For allergy symptoms 30 tablet 11  . gabapentin (NEURONTIN) 300 MG capsule TAKE 1 CAPSULE BY MOUTH THREE TIMES A DAY 270 capsule 0   No current facility-administered medications for this visit.       ___________________________________________________________________ Objective   Exam:  BP 124/82   Pulse 88   Temp 98.2 F (36.8 C)   Ht 6' (1.829 m)   Wt 230 lb (104.3 kg)   BMI 31.19 kg/m    General: Well-appearing  Eyes: sclera anicteric, no redness  ENT: oral mucosa moist without lesions, no cervical or supraclavicular lymphadenopathy  CV: RRR without murmur, S1/S2, no JVD, no peripheral edema  Resp: clear to auscultation bilaterally, normal RR and effort noted  GI: soft, no tenderness, with active bowel sounds. No guarding or palpable organomegaly noted.  Skin; warm and dry, no rash or jaundice noted  Neuro: awake, alert and oriented x 3. Normal gross motor function and fluent speech : Normal external, no fissure or tenderness.  Prostate soft.  There is a possible soft tissue fullness in the mid rectum, anteriorly.  Stool heme positive  Labs:  Hemoglobin 16  Assessment: Encounter Diagnoses  Name Primary?  . Rectal bleeding Yes  . Chronic constipation     The bleeding sounds much like constipation related hemorrhoidal bleeding, however there may be something felt on exam and his stool is still heme positive.  I have strongly advised him to have a colonoscopy in the next couple of weeks.  He is agreeable after discussion of procedure and risks.  The benefits and risks of the planned procedure were described in detail with the patient or (when appropriate) their health care proxy.  Risks were outlined as including, but not limited to, bleeding, infection, perforation, adverse medication reaction leading to cardiac or pulmonary decompensation.  The limitation of incomplete mucosal visualization was also discussed.  No guarantees or warranties were given.    Thank you for the courtesy of this consult.  Please call me with any questions or concerns.  Nelida Meuse III  CC: Referring provider noted above

## 2018-08-15 ENCOUNTER — Telehealth: Payer: Self-pay | Admitting: *Deleted

## 2018-08-15 NOTE — Telephone Encounter (Signed)
Covid-19 travel screening questions  Have you traveled in the last 14 days? NO If yes where?  Do you now or have you had a fever in the last 14 days? NO  Do you have any respiratory symptoms of shortness of breath or cough now or in the last 14 days? NO  Do you have a medical history of Congestive Heart Failure? NO  Do you have a medical history of lung disease? NO  Do you have any family members or close contacts with diagnosed or suspected Covid-19? NO        

## 2018-08-16 ENCOUNTER — Ambulatory Visit (AMBULATORY_SURGERY_CENTER): Payer: BC Managed Care – PPO | Admitting: Gastroenterology

## 2018-08-16 ENCOUNTER — Other Ambulatory Visit: Payer: Self-pay

## 2018-08-16 ENCOUNTER — Encounter: Payer: Self-pay | Admitting: Gastroenterology

## 2018-08-16 VITALS — BP 98/59 | HR 74 | Temp 97.4°F | Resp 14 | Ht 72.0 in | Wt 230.0 lb

## 2018-08-16 DIAGNOSIS — K625 Hemorrhage of anus and rectum: Secondary | ICD-10-CM

## 2018-08-16 DIAGNOSIS — D123 Benign neoplasm of transverse colon: Secondary | ICD-10-CM

## 2018-08-16 DIAGNOSIS — D128 Benign neoplasm of rectum: Secondary | ICD-10-CM

## 2018-08-16 DIAGNOSIS — D122 Benign neoplasm of ascending colon: Secondary | ICD-10-CM

## 2018-08-16 DIAGNOSIS — D125 Benign neoplasm of sigmoid colon: Secondary | ICD-10-CM

## 2018-08-16 MED ORDER — SODIUM CHLORIDE 0.9 % IV SOLN: 500.0000 mL | Freq: Once | INTRAVENOUS | Status: DC

## 2018-08-16 NOTE — Op Note (Signed)
[place] Patient Name: Jeffrey Lopez Procedure Date: 08/16/2018 9:07 AM MRN: [id number] Endoscopist: Mallie Mussel L. Loletha Carrow , MD Age: 48 Referring MD:  Date of Birth: [birth year] Gender: Male Account #: [id number] Procedure:                Colonoscopy Indications:              Rectal bleeding Medicines:                Monitored Anesthesia Care Procedure:                Pre-Anesthesia Assessment:                           - Prior to the procedure, a History and Physical                            was performed, and patient medications and                            allergies were reviewed. The patient's tolerance of                            previous anesthesia was also reviewed. The risks                            and benefits of the procedure and the sedation                            options and risks were discussed with the patient.                            All questions were answered, and informed consent                            was obtained. Prior Anticoagulants: The patient has                            taken no previous anticoagulant or antiplatelet                            agents. ASA Grade Assessment: II - A patient with                            mild systemic disease. After reviewing the risks                            and benefits, the patient was deemed in                            satisfactory condition to undergo the procedure.                           After obtaining informed consent, the colonoscope  was passed under direct vision. Throughout the                            procedure, the patient's blood pressure, pulse, and                            oxygen saturations were monitored continuously. The                            Colonoscope was introduced through the anus and                            advanced to the the cecum, identified by                            appendiceal orifice and ileocecal valve. The                             colonoscopy was performed without difficulty. The                            patient tolerated the procedure well. The quality                            of the bowel preparation was initially fair, then                            improved to good after lavage. The ileocecal valve,                            appendiceal orifice, and rectum were photographed. Scope In: 9:12:22 AM Scope Out: 9:37:19 AM Scope Withdrawal Time: 0 hours 21 minutes 17 seconds  Total Procedure Duration: 0 hours 24 minutes 57 seconds  Findings:                 The perianal and digital rectal examinations were                            normal.                           Two sessile polyps were found in the ascending                            colon. The polyps were 4 to 6 mm in size. These                            polyps were removed with a cold snare. Resection                            and retrieval were complete.                           A 8 mm polyp was found in the splenic flexure.  The                            polyp was pedunculated. The polyp was removed with                            a hot snare. Resection and retrieval were complete.                           A 10 mm polyp was found in the sigmoid colon. The                            polyp was pedunculated. The polyp was removed with                            a hot snare. Resection and retrieval were complete.                           A 12 mm polyp was found in the mid rectum (left                            lateral wall, between proximal and middle rectal                            folds). The polyp was pedunculated. The polyp was                            removed with a hot snare. Resection and retrieval                            were complete.                           Internal hemorrhoids were found. The hemorrhoids                            were small.                           The exam was otherwise without abnormality on                             direct and retroflexion views. Complications:            No immediate complications. Estimated Blood Loss:     Estimated blood loss was minimal. Impression:               - Two 4 to 6 mm polyps in the ascending colon,                            removed with a cold snare. Resected and retrieved.                           - One 8 mm polyp at the splenic flexure, removed  with a hot snare. Resected and retrieved.                           - One 10 mm polyp in the sigmoid colon, removed                            with a hot snare. Resected and retrieved.                           - One 12 mm polyp in the rectum, removed with a hot                            snare. Resected and retrieved.                           - Internal hemorrhoids.                           - The examination was otherwise normal on direct                            and retroflexion views. Recommendation:           - Patient has a contact number available for                            emergencies. The signs and symptoms of potential                            delayed complications were discussed with the                            patient. Return to normal activities tomorrow.                            Written discharge instructions were provided to the                            patient.                           - Resume previous diet.                           - Continue present medications.                           - Await pathology results.                           - Repeat colonoscopy is recommended for                            surveillance. The colonoscopy date will be  determined after pathology results from today's                            exam become available for review.                           - Continue current high fiber diet to avoid                            constipation and rectal bleeding. Henry L. Loletha Carrow,  MD 08/16/2018 9:44:34 AM This report has been signed electronically.

## 2018-08-16 NOTE — Patient Instructions (Signed)
Handouts given for Hemorrhoids and Polyps.  YOU HAD AN ENDOSCOPIC PROCEDURE TODAY AT [facility]:   Refer to the procedure report that was given to you for any specific questions about what was found during the examination.  If the procedure report does not answer your questions, please call your gastroenterologist to clarify.  If you requested that your care partner not be given the details of your procedure findings, then the procedure report has been included in a sealed envelope for you to review at your convenience later.  YOU SHOULD EXPECT: Some feelings of bloating in the abdomen. Passage of more gas than usual.  Walking can help get rid of the air that was put into your GI tract during the procedure and reduce the bloating. If you had a lower endoscopy (such as a colonoscopy or flexible sigmoidoscopy) you may notice spotting of blood in your stool or on the toilet paper. If you underwent a bowel prep for your procedure, you may not have a normal bowel movement for a few days.  Please Note:  You might notice some irritation and congestion in your nose or some drainage.  This is from the oxygen used during your procedure.  There is no need for concern and it should clear up in a day or so.  SYMPTOMS TO REPORT IMMEDIATELY:   Following lower endoscopy (colonoscopy or flexible sigmoidoscopy):  Excessive amounts of blood in the stool  Significant tenderness or worsening of abdominal pains  Swelling of the abdomen that is new, acute  Fever of 100F or higher   For urgent or emergent issues, a gastroenterologist can be reached at any hour by calling [phone].   DIET:  We do recommend a small meal at first, but then you may proceed to your regular diet.  Drink plenty of fluids but you should avoid alcoholic beverages for 24 hours.  ACTIVITY:  You should plan to take it easy for the rest of today and you should NOT DRIVE or use heavy machinery until tomorrow (because of  the sedation medicines used during the test).    FOLLOW UP: Our staff will call the number listed on your records the next business day following your procedure to check on you and address any questions or concerns that you may have regarding the information given to you following your procedure. If we do not reach you, we will leave a message.  However, if you are feeling well and you are not experiencing any problems, there is no need to return our call.  We will assume that you have returned to your regular daily activities without incident.  If any biopsies were taken you will be contacted by phone or by letter within the next 1-3 weeks.  Please call us at [phone] if you have not heard about the biopsies in 3 weeks.    SIGNATURES/CONFIDENTIALITY: You and/or your care partner have signed paperwork which will be entered into your electronic medical record.  These signatures attest to the fact that that the information above on your After Visit Summary has been reviewed and is understood.  Full responsibility of the confidentiality of this discharge information lies with you and/or your care-partner.

## 2018-08-16 NOTE — Progress Notes (Signed)
Called to room to assist during endoscopic procedure.  Patient ID and intended procedure confirmed with present staff. Received instructions for my participation in the procedure from the performing physician.  

## 2018-08-17 ENCOUNTER — Telehealth: Payer: Self-pay

## 2018-08-17 NOTE — Telephone Encounter (Signed)
  Follow up Call-  Call back number 08/16/2018  Post procedure Call Back phone  # [phone]  Permission to leave phone message Yes  Some recent data might be hidden     Patient questions:  Do you have a fever, pain , or abdominal swelling? No. Pain Score  0 *  Have you tolerated food without any problems? Yes.    Have you been able to return to your normal activities? Yes.    Do you have any questions about your discharge instructions: Diet   No. Medications  No. Follow up visit  No.  Do you have questions or concerns about your Care? No.  Actions: * If pain score is 4 or above: No action needed, pain <4.

## 2018-08-24 ENCOUNTER — Encounter: Payer: Self-pay | Admitting: Gastroenterology

## 2018-10-27 ENCOUNTER — Other Ambulatory Visit: Payer: Self-pay

## 2018-10-28 ENCOUNTER — Ambulatory Visit: Payer: BC Managed Care – PPO | Admitting: Physician Assistant

## 2018-10-28 ENCOUNTER — Encounter: Payer: Self-pay | Admitting: Physician Assistant

## 2018-10-28 VITALS — BP 128/89 | HR 83 | Temp 97.4°F | Ht 72.0 in | Wt 223.4 lb

## 2018-10-28 DIAGNOSIS — D126 Benign neoplasm of colon, unspecified: Secondary | ICD-10-CM | POA: Diagnosis not present

## 2018-10-28 DIAGNOSIS — Z0000 Encounter for general adult medical examination without abnormal findings: Secondary | ICD-10-CM

## 2018-10-28 DIAGNOSIS — G250 Essential tremor: Secondary | ICD-10-CM

## 2018-10-28 DIAGNOSIS — Z0001 Encounter for general adult medical examination with abnormal findings: Secondary | ICD-10-CM

## 2018-10-28 MED ORDER — FEXOFENADINE HCL 180 MG PO TABS: 180.0000 mg | ORAL_TABLET | Freq: Every day | ORAL | 4 refills | Status: AC

## 2018-10-28 MED ORDER — GABAPENTIN 300 MG PO CAPS: ORAL_CAPSULE | ORAL | 3 refills | Status: DC

## 2018-10-28 MED ORDER — FLUTICASONE PROPIONATE 50 MCG/ACT NA SUSP: 2.0000 | Freq: Every day | NASAL | 4 refills | Status: AC

## 2018-10-28 MED ORDER — ATORVASTATIN CALCIUM 10 MG PO TABS: 10.0000 mg | ORAL_TABLET | Freq: Every day | ORAL | 4 refills | Status: DC

## 2018-10-28 NOTE — Progress Notes (Signed)
 BP 128/89   Pulse 83   Temp (!) 97.4 F (36.3 C) (Oral)   Ht 6' (1.829 m)   Wt 223 lb 6.4 oz (101.3 kg)   BMI 30.30 kg/m    Subjective:    Patient ID: Jeffrey Lopez, male    DOB: 05/10/1971, 48 y.o.   MRN: [id number]  HPI: Jeffrey Lopez is a 48 y.o. male presenting on 10/28/2018 for Medical Management of Chronic Issues (6 month )  This patient comes in for annual well physical examination. All medications are reviewed today. There are no reports of any problems with the medications. All of the medical conditions are reviewed and updated.  Lab work is reviewed and will be ordered as medically necessary. There are no new problems reported with today's visit.  Patient reports doing well overall.  He had a colonoscopy performed in March of this year and it did show 5 adenomatous polyps that were removed, with no evidence of dysplasia or malignancy.  The letter sent to him from the gastroenterologist was to have him repeat his colonoscopy in 3 years.  We have gone over this information today.  He states that his tremor is still well controlled with the gabapentin.  Annual labs will be performed today.  Other refills for his chronic medications are also being sent today.  Past Medical History:  Diagnosis Date  . Allergy   . 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 Hyperlipidemia   . Nerve pain   . Seasonal allergies    Relevant past medical, surgical, family and social history reviewed and updated as indicated. Interim medical history since our last visit reviewed. Allergies and medications reviewed and updated. DATA REVIEWED: CHART IN EPIC  Family History reviewed for pertinent findings.  Review of Systems  Constitutional: Negative.  Negative for appetite change and fatigue.  HENT: Negative.   Eyes: Negative.  Negative for pain and visual disturbance.  Respiratory: Negative.  Negative for cough, chest tightness, shortness of breath and wheezing.    Cardiovascular: Negative.  Negative for chest pain, palpitations and leg swelling.  Gastrointestinal: Negative.  Negative for abdominal pain, diarrhea, nausea and vomiting.  Endocrine: Negative.   Genitourinary: Negative.   Musculoskeletal: Positive for arthralgias and gait problem.  Skin: Negative.  Negative for color change and rash.  Neurological: Negative for weakness, numbness and headaches.  Psychiatric/Behavioral: Negative.     Allergies as of 10/28/2018   No Known Allergies     Medication List       Accurate as of October 28, 2018  1:53 PM. If you have any questions, ask your nurse or doctor.        atorvastatin 10 MG tablet Commonly known as: LIPITOR Take 1 tablet (10 mg total) by mouth daily.   fexofenadine 180 MG tablet Commonly known as: ALLEGRA Take 1 tablet (180 mg total) by mouth daily. For allergy symptoms   fluticasone 50 MCG/ACT nasal spray Commonly known as: FLONASE Place 2 sprays into both nostrils daily. Started by: Angel S Jones, PA-C   gabapentin 300 MG capsule Commonly known as: NEURONTIN TAKE 1 CAPSULE BY MOUTH THREE TIMES A DAY          Objective:    BP 128/89   Pulse 83   Temp (!) 97.4 F (36.3 C) (Oral)   Ht 6' (1.829 m)   Wt 223 lb 6.4 oz (101.3 kg)   BMI 30.30 kg/m   No Known Allergies  Wt Readings   from Last 3 Encounters:  10/28/18 223 lb 6.4 oz (101.3 kg)  08/16/18 230 lb (104.3 kg)  08/12/18 230 lb (104.3 kg)    Physical Exam Constitutional:      Appearance: He is well-developed.  HENT:     Head: Normocephalic and atraumatic.  Eyes:     Conjunctiva/sclera: Conjunctivae normal.     Pupils: Pupils are equal, round, and reactive to light.  Neck:     Musculoskeletal: Normal range of motion and neck supple.  Cardiovascular:     Rate and Rhythm: Normal rate and regular rhythm.     Heart sounds: Normal heart sounds.  Pulmonary:     Effort: Pulmonary effort is normal.     Breath sounds: Normal breath sounds.  Abdominal:      General: Bowel sounds are normal.     Palpations: Abdomen is soft.  Musculoskeletal: Normal range of motion.  Skin:    General: Skin is warm and dry.     Results for orders placed or performed in visit on 07/14/18  Hemoglobin, fingerstick  Result Value Ref Range   Hemoglobin 16.0 12.6 - 17.7 g/dL      Assessment & Plan:   1. Tremor, essential - gabapentin (NEURONTIN) 300 MG capsule; TAKE 1 CAPSULE BY MOUTH THREE TIMES A DAY  Dispense: 270 capsule; Refill: 3 - CMP14+EGFR - CBC with Differential/Platelet - Lipid panel  2. Adenomatous polyp of colon, unspecified part of colon Follow GI in 3 years  3. Well adult exam - CMP14+EGFR - CBC with Differential/Platelet - Lipid panel   Continue all other maintenance medications as listed above.  Follow up plan: Return in about 1 year (around 10/28/2019).  Educational handout given for adenomatous polyps  Terald Sleeper PA-C [facility] [address]  [place], [place] [address] [phone]   10/28/2018, 1:53 PM

## 2018-10-29 LAB — CBC WITH DIFFERENTIAL/PLATELET
Basophils Absolute: 0.1 10*3/uL (ref 0.0–0.2)
Basos: 1 %
EOS (ABSOLUTE): 0.3 10*3/uL (ref 0.0–0.4)
Eos: 3 %
Hematocrit: 47.2 % (ref 37.5–51.0)
Hemoglobin: 16.1 g/dL (ref 13.0–17.7)
Immature Grans (Abs): 0.1 10*3/uL (ref 0.0–0.1)
Immature Granulocytes: 1 %
Lymphocytes Absolute: 2 10*3/uL (ref 0.7–3.1)
Lymphs: 19 %
MCH: 30.7 pg (ref 26.6–33.0)
MCHC: 34.1 g/dL (ref 31.5–35.7)
MCV: 90 fL (ref 79–97)
Monocytes Absolute: 0.7 10*3/uL (ref 0.1–0.9)
Monocytes: 7 %
Neutrophils Absolute: 7 10*3/uL (ref 1.4–7.0)
Neutrophils: 69 %
Platelets: 282 10*3/uL (ref 150–450)
RBC: 5.25 x10E6/uL (ref 4.14–5.80)
RDW: 12.1 % (ref 11.6–15.4)
WBC: 10.2 10*3/uL (ref 3.4–10.8)

## 2018-10-29 LAB — CMP14+EGFR
ALT: 26 IU/L (ref 0–44)
AST: 19 IU/L (ref 0–40)
Albumin/Globulin Ratio: 1.8 (ref 1.2–2.2)
Albumin: 4.1 g/dL (ref 4.0–5.0)
Alkaline Phosphatase: 124 IU/L — ABNORMAL HIGH (ref 39–117)
BUN/Creatinine Ratio: 9 (ref 9–20)
BUN: 9 mg/dL (ref 6–24)
Bilirubin Total: 0.5 mg/dL (ref 0.0–1.2)
CO2: 23 mmol/L (ref 20–29)
Calcium: 8.5 mg/dL — ABNORMAL LOW (ref 8.7–10.2)
Chloride: 105 mmol/L (ref 96–106)
Creatinine, Ser: 0.96 mg/dL (ref 0.76–1.27)
GFR calc Af Amer: 108 mL/min/{1.73_m2} (ref >59)
GFR calc non Af Amer: 94 mL/min/{1.73_m2} (ref >59)
Globulin, Total: 2.3 g/dL (ref 1.5–4.5)
Glucose: 81 mg/dL (ref 65–99)
Potassium: 4.1 mmol/L (ref 3.5–5.2)
Sodium: 141 mmol/L (ref 134–144)
Total Protein: 6.4 g/dL (ref 6.0–8.5)

## 2018-10-29 LAB — LIPID PANEL
Chol/HDL Ratio: 5.3 ratio — ABNORMAL HIGH (ref 0.0–5.0)
Cholesterol, Total: 190 mg/dL (ref 100–199)
HDL: 36 mg/dL — ABNORMAL LOW (ref >39)
LDL Calculated: 128 mg/dL — ABNORMAL HIGH (ref 0–99)
Triglycerides: 131 mg/dL (ref 0–149)
VLDL Cholesterol Cal: 26 mg/dL (ref 5–40)

## 2018-11-25 ENCOUNTER — Ambulatory Visit: Payer: BC Managed Care – PPO | Admitting: Physician Assistant

## 2019-03-17 ENCOUNTER — Other Ambulatory Visit: Payer: Self-pay

## 2019-03-17 DIAGNOSIS — Z20822 Contact with and (suspected) exposure to covid-19: Secondary | ICD-10-CM

## 2019-03-19 LAB — NOVEL CORONAVIRUS, NAA: SARS-CoV-2, NAA: NOT DETECTED

## 2019-03-27 ENCOUNTER — Encounter: Payer: Self-pay | Admitting: Physician Assistant

## 2019-03-27 ENCOUNTER — Other Ambulatory Visit: Payer: Self-pay | Admitting: Physician Assistant

## 2019-03-27 MED ORDER — CHANTIX STARTING MONTH PAK 0.5 MG X 11 & 1 MG X 42 PO TABS: ORAL_TABLET | ORAL | 0 refills | Status: DC

## 2019-03-27 MED ORDER — VARENICLINE TARTRATE 1 MG PO TABS: 1.0000 mg | ORAL_TABLET | Freq: Two times a day (BID) | ORAL | 5 refills | Status: DC

## 2019-04-27 ENCOUNTER — Other Ambulatory Visit: Payer: Self-pay | Admitting: Physician Assistant

## 2019-11-17 ENCOUNTER — Other Ambulatory Visit: Payer: Self-pay

## 2019-11-17 NOTE — Telephone Encounter (Signed)
Wrong number listed in chart

## 2019-11-17 NOTE — Telephone Encounter (Signed)
Chanxtix continuing pack denied Needs to be seen Last office visit 10/28/2018

## 2019-11-17 NOTE — Telephone Encounter (Signed)
Fluticasone denied Patient needs to be seen Last office visit 10/28/2018

## 2019-11-17 NOTE — Telephone Encounter (Signed)
Pt aware and says he will call back to schedule appt.

## 2021-09-30 ENCOUNTER — Encounter: Payer: Self-pay | Admitting: Gastroenterology

## 2023-03-30 ENCOUNTER — Ambulatory Visit (INDEPENDENT_AMBULATORY_CARE_PROVIDER_SITE_OTHER): Payer: Commercial Managed Care - PPO | Admitting: Internal Medicine

## 2023-03-30 ENCOUNTER — Encounter: Payer: Self-pay | Admitting: Internal Medicine

## 2023-03-30 VITALS — BP 157/103 | HR 86 | Temp 97.8°F | Wt 217.4 lb

## 2023-03-30 DIAGNOSIS — R030 Elevated blood-pressure reading, without diagnosis of hypertension: Secondary | ICD-10-CM

## 2023-03-30 DIAGNOSIS — Z131 Encounter for screening for diabetes mellitus: Secondary | ICD-10-CM

## 2023-03-30 DIAGNOSIS — K219 Gastro-esophageal reflux disease without esophagitis: Secondary | ICD-10-CM

## 2023-03-30 DIAGNOSIS — Z1329 Encounter for screening for other suspected endocrine disorder: Secondary | ICD-10-CM

## 2023-03-30 DIAGNOSIS — E291 Testicular hypofunction: Secondary | ICD-10-CM

## 2023-03-30 DIAGNOSIS — Z79890 Hormone replacement therapy (postmenopausal): Secondary | ICD-10-CM

## 2023-03-30 DIAGNOSIS — Z1321 Encounter for screening for nutritional disorder: Secondary | ICD-10-CM

## 2023-03-30 DIAGNOSIS — J4521 Mild intermittent asthma with (acute) exacerbation: Secondary | ICD-10-CM | POA: Diagnosis not present

## 2023-03-30 DIAGNOSIS — R053 Chronic cough: Secondary | ICD-10-CM

## 2023-03-30 DIAGNOSIS — E782 Mixed hyperlipidemia: Secondary | ICD-10-CM

## 2023-03-30 DIAGNOSIS — Z114 Encounter for screening for human immunodeficiency virus [HIV]: Secondary | ICD-10-CM

## 2023-03-30 DIAGNOSIS — E785 Hyperlipidemia, unspecified: Secondary | ICD-10-CM | POA: Insufficient documentation

## 2023-03-30 DIAGNOSIS — Z1159 Encounter for screening for other viral diseases: Secondary | ICD-10-CM

## 2023-03-30 DIAGNOSIS — Z13220 Encounter for screening for lipoid disorders: Secondary | ICD-10-CM

## 2023-03-30 DIAGNOSIS — J302 Other seasonal allergic rhinitis: Secondary | ICD-10-CM

## 2023-03-30 MED ORDER — PREDNISONE 20 MG PO TABS: 20.0000 mg | ORAL_TABLET | Freq: Every day | ORAL | 0 refills | Status: DC

## 2023-03-30 MED ORDER — PANTOPRAZOLE SODIUM 40 MG PO TBEC: 40.0000 mg | DELAYED_RELEASE_TABLET | Freq: Every day | ORAL | 2 refills | Status: DC

## 2023-03-30 MED ORDER — ALBUTEROL SULFATE HFA 108 (90 BASE) MCG/ACT IN AERS: 2.0000 | INHALATION_SPRAY | Freq: Four times a day (QID) | RESPIRATORY_TRACT | 0 refills | Status: DC | PRN

## 2023-03-30 NOTE — Assessment & Plan Note (Signed)
His acute concern today is a cough that has been present for at least 3 weeks and is intermittently productive of clear sputum.  He further denies associated symptoms.  Pulmonary exam today is rather unremarkable.  He does have a history of seasonal allergies but is unaware of a history of asthma or COPD. -Given persistence of symptoms, I have prescribed an albuterol inhaler for as needed symptom relief and prednisone 20 mg x 5 days -Will also add Protonix 40 mg daily given report of symptoms worsening at night with concern for reflux as a potential etiology. -Follow-up in 4 weeks for reassessment.  Will pursue imaging at that time if symptoms have not improved.

## 2023-03-30 NOTE — Assessment & Plan Note (Signed)
He is using cetirizine and fluticasone nasal spray daily for symptom relief

## 2023-03-30 NOTE — Assessment & Plan Note (Signed)
Previously documented history of hyperlipidemia.  He was prescribed atorvastatin 10 mg daily at one point but is not taking it currently. -Repeat lipid panel ordered today

## 2023-03-30 NOTE — Progress Notes (Signed)
New Patient Office Visit  Subjective    Patient ID: Jeffrey Lopez, male    DOB: 1970/12/07  Age: 52 y.o. MRN: [id number]  CC:  Chief Complaint  Patient presents with   Cough    HPI Jeffrey Lopez presents to establish care.  He is a 52 year old male who endorses a past medical history significant for hypogonadism, seasonal allergies, and HLD.  Previously followed at [facility], but has not been seen since November 2020.  In the interim he has received care at [facility] in [place], [place] for testosterone replacement therapy.  Jeffrey Lopez acute concern today is a cough that has been present for the last 3 weeks.  It is intermittently productive of clear sputum.  The cough is worse at night.  He further denies associated symptoms such as fever/chills, shortness of breath, and sinus/nasal congestion.  He presented to urgent care 10 days ago and was prescribed Mucinex DM, which has not improved his symptoms.  He states that he develops similar symptoms around this time of year annually and symptoms have previously been alleviated with codeine containing cough syrup.  He does not have any additional concerns to discuss today.  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  He endorses former tobacco use, quitting in 2018, and denies alcohol and illicit drug use.  He is unaware of any significant family medical history.  Acute concerns, chronic medical conditions, and outstanding preventative care items discussed today are individually addressed in A/P below.   Outpatient Encounter Medications as of 03/30/2023  Medication Sig   albuterol (VENTOLIN HFA) 108 (90 Base) MCG/ACT inhaler Inhale 2 puffs into the lungs every 6 (six) hours as needed for wheezing or shortness of breath.   fexofenadine (ALLEGRA) 180 MG tablet Take 1 tablet (180 mg total) by mouth daily. For allergy symptoms   fluticasone (FLONASE) 50 MCG/ACT nasal spray Place 2 sprays into both nostrils daily.    pantoprazole (PROTONIX) 40 MG tablet Take 1 tablet (40 mg total) by mouth daily.   predniSONE (DELTASONE) 20 MG tablet Take 1 tablet (20 mg total) by mouth daily with breakfast.   [DISCONTINUED] atorvastatin (LIPITOR) 10 MG tablet Take 1 tablet (10 mg total) by mouth daily. (Patient not taking: Reported on 03/30/2023)   [DISCONTINUED] CHANTIX STARTING MONTH PAK 0.5 MG X 11 & 1 MG X 42 tablet TAKE ONE 0.5 MG TABLET BY MOUTH ONCE DAILY FOR 3 DAYS, THEN INCREASE TO ONE 0.5 MG TABLET TWICE DAILY FOR 4 DAYS, THEN INCREASE TO ONE 1 MG TABLET TWICE DAILY. (Patient not taking: Reported on 03/30/2023)   [DISCONTINUED] gabapentin (NEURONTIN) 300 MG capsule TAKE 1 CAPSULE BY MOUTH THREE TIMES A DAY (Patient not taking: Reported on 03/30/2023)   [DISCONTINUED] varenicline (CHANTIX CONTINUING MONTH PAK) 1 MG tablet Take 1 tablet (1 mg total) by mouth 2 (two) times daily. (Patient not taking: Reported on 03/30/2023)   No facility-administered encounter medications on file as of 03/30/2023.    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   Hyperlipidemia    Nerve pain    Seasonal allergies     Past Surgical History:  Procedure Laterality Date   CERVICAL DISC SURGERY      Family History  Problem Relation Age of Onset   Asthma Mother        Deceased, 71   Heart attack Father  Deceased, 57   Diabetes Paternal Aunt    Healthy Sister    Healthy Daughter    Colon cancer Neg Hx    Stomach cancer Neg Hx    Pancreatic cancer Neg Hx    Esophageal cancer Neg Hx    Rectal cancer Neg Hx     Social History   Socioeconomic History   Marital status: Single    Spouse name: Not on file   Number of children: Not on file   Years of education: Not on file   Highest education level: Not on file  Occupational History   Not on file  Tobacco Use   Smoking status: Former    Current packs/day: 0.00    Average packs/day: 1 pack/day for  20.0 years (20.0 ttl pk-yrs)    Types: Cigarettes    Start date: 07/04/1993    Quit date: 07/04/2013    Years since quitting: 9.7   Smokeless tobacco: Former   Tobacco comments:    on the patch  Vaping Use   Vaping status: Never Used  Substance and Sexual Activity   Alcohol use: Yes    Alcohol/week: 0.0 standard drinks of alcohol    Comment: Rarely   Drug use: No   Sexual activity: Not on file  Other Topics Concern   Not on file  Social History Narrative   Lives with daughter in a 2 story home.     Works with a Physicist, medical.     Education: vocational school.    Social Determinants of Health   Financial Resource Strain: Not on file  Food Insecurity: Not on file  Transportation Needs: Not on file  Physical Activity: Not on file  Stress: Not on file  Social Connections: Not on file  Intimate Partner Violence: Not on file   Review of Systems  Respiratory:  Positive for cough (Persistent x 3 weeks) and sputum production (Intermittent clear sputum production).   All other systems reviewed and are negative.  Objective    BP (!) 157/103   Pulse 86   Temp 97.8 F (36.6 C)   Wt 217 lb 6.4 oz (98.6 kg)   SpO2 97%   BMI 29.48 kg/m   Physical Exam Vitals reviewed.  Constitutional:      General: He is not in acute distress.    Appearance: Normal appearance. He is not ill-appearing.  HENT:     Head: Normocephalic and atraumatic.     Right Ear: External ear normal.     Left Ear: External ear normal.     Nose: Nose normal. No congestion or rhinorrhea.     Mouth/Throat:     Mouth: Mucous membranes are moist.     Pharynx: Oropharynx is clear.  Eyes:     General: No scleral icterus.    Extraocular Movements: Extraocular movements intact.     Conjunctiva/sclera: Conjunctivae normal.     Pupils: Pupils are equal, round, and reactive to light.  Cardiovascular:     Rate and Rhythm: Normal rate and regular rhythm.     Pulses: Normal pulses.     Heart sounds:  Normal heart sounds. No murmur heard. Pulmonary:     Effort: Pulmonary effort is normal.     Breath sounds: Normal breath sounds. No wheezing, rhonchi or rales.  Abdominal:     General: Abdomen is flat. Bowel sounds are normal. There is no distension.     Palpations: Abdomen is soft.     Tenderness: There is no  abdominal tenderness.  Musculoskeletal:        General: No swelling or deformity. Normal range of motion.     Cervical back: Normal range of motion.     Comments: Ectrodactyly bilaterally  Skin:    General: Skin is warm and dry.     Capillary Refill: Capillary refill takes less than 2 seconds.  Neurological:     General: No focal deficit present.     Mental Status: He is alert and oriented to person, place, and time.     Motor: No weakness.  Psychiatric:        Mood and Affect: Mood normal.        Behavior: Behavior normal.        Thought Content: Thought content normal.    Assessment & Plan:   Problem List Items Addressed This Visit       Hypogonadism in male    He is currently followed at [facility] in [place], [place] where he receives twice weekly testosterone injections for treatment of hypogonadism.  Reports that he is also taking an estrogen blocker but cannot recall the name.  PDMP reviewed. -Basic labs ordered today      Persistent cough for 3 weeks or longer    His acute concern today is a cough that has been present for at least 3 weeks and is intermittently productive of clear sputum.  He further denies associated symptoms.  Pulmonary exam today is rather unremarkable.  He does have a history of seasonal allergies but is unaware of a history of asthma or COPD. -Given persistence of symptoms, I have prescribed an albuterol inhaler for as needed symptom relief and prednisone 20 mg x 5 days -Will also add Protonix 40 mg daily given report of symptoms worsening at night with concern for reflux as a potential etiology. -Follow-up in 4 weeks for reassessment.   Will pursue imaging at that time if symptoms have not improved.      Hyperlipidemia    Previously documented history of hyperlipidemia.  He was prescribed atorvastatin 10 mg daily at one point but is not taking it currently. -Repeat lipid panel ordered today      Seasonal allergies    He is using cetirizine and fluticasone nasal spray daily for symptom relief      Return in about 4 weeks (around 04/27/2023) for bp check, lab review.   Billie Lade, MD

## 2023-03-30 NOTE — Patient Instructions (Signed)
It was a pleasure to see you today.  Thank you for giving [place] the opportunity to be involved in your care.  Below is a brief recap of your visit and next steps.  We will plan to see you again in 4 weeks.  Summary You have established care today We will check basic labs Prednisone, albuterol, and protonix prescribed for management of persistent cough Follow up in 4 week for BP check, lab review, and reassessment.

## 2023-03-30 NOTE — Assessment & Plan Note (Signed)
He is currently followed at [facility] in [place], [place] where he receives twice weekly testosterone injections for treatment of hypogonadism.  Reports that he is also taking an estrogen blocker but cannot recall the name.  PDMP reviewed. -Basic labs ordered today

## 2023-03-31 ENCOUNTER — Other Ambulatory Visit: Payer: Self-pay | Admitting: Internal Medicine

## 2023-03-31 DIAGNOSIS — E782 Mixed hyperlipidemia: Secondary | ICD-10-CM

## 2023-03-31 MED ORDER — ATORVASTATIN CALCIUM 10 MG PO TABS: 10.0000 mg | ORAL_TABLET | Freq: Every day | ORAL | 3 refills | Status: DC

## 2023-04-01 LAB — CBC WITH DIFFERENTIAL/PLATELET
Basophils Absolute: 0.1 10*3/uL (ref 0.0–0.2)
Basos: 1 %
EOS (ABSOLUTE): 0.7 10*3/uL — ABNORMAL HIGH (ref 0.0–0.4)
Eos: 5 %
Hematocrit: 53.5 % — ABNORMAL HIGH (ref 37.5–51.0)
Hemoglobin: 17.9 g/dL — ABNORMAL HIGH (ref 13.0–17.7)
Immature Grans (Abs): 0.2 10*3/uL — ABNORMAL HIGH (ref 0.0–0.1)
Immature Granulocytes: 1 %
Lymphocytes Absolute: 2.1 10*3/uL (ref 0.7–3.1)
Lymphs: 15 %
MCH: 30 pg (ref 26.6–33.0)
MCHC: 33.5 g/dL (ref 31.5–35.7)
MCV: 90 fL (ref 79–97)
Monocytes Absolute: 1.1 10*3/uL — ABNORMAL HIGH (ref 0.1–0.9)
Monocytes: 8 %
Neutrophils Absolute: 9.5 10*3/uL — ABNORMAL HIGH (ref 1.4–7.0)
Neutrophils: 70 %
Platelets: 322 10*3/uL (ref 150–450)
RBC: 5.96 x10E6/uL — ABNORMAL HIGH (ref 4.14–5.80)
RDW: 12.6 % (ref 11.6–15.4)
WBC: 13.6 10*3/uL — ABNORMAL HIGH (ref 3.4–10.8)

## 2023-04-01 LAB — CMP14+EGFR
ALT: 18 [IU]/L (ref 0–44)
AST: 14 [IU]/L (ref 0–40)
Albumin: 4 g/dL (ref 3.8–4.9)
Alkaline Phosphatase: 106 IU/L (ref 44–121)
BUN/Creatinine Ratio: 7 — ABNORMAL LOW (ref 9–20)
BUN: 7 mg/dL (ref 6–24)
Bilirubin Total: 0.3 mg/dL (ref 0.0–1.2)
CO2: 24 mmol/L (ref 20–29)
Calcium: 8.8 mg/dL (ref 8.7–10.2)
Chloride: 101 mmol/L (ref 96–106)
Creatinine, Ser: 0.98 mg/dL (ref 0.76–1.27)
Globulin, Total: 2.6 g/dL (ref 1.5–4.5)
Glucose: 70 mg/dL (ref 70–99)
Potassium: 4.7 mmol/L (ref 3.5–5.2)
Sodium: 141 mmol/L (ref 134–144)
Total Protein: 6.6 g/dL (ref 6.0–8.5)
eGFR: 93 mL/min/{1.73_m2} (ref >59)

## 2023-04-01 LAB — LIPID PANEL
Chol/HDL Ratio: 5.1 ratio — ABNORMAL HIGH (ref 0.0–5.0)
Cholesterol, Total: 175 mg/dL (ref 100–199)
HDL: 34 mg/dL — ABNORMAL LOW (ref >39)
LDL Chol Calc (NIH): 122 mg/dL — ABNORMAL HIGH (ref 0–99)
Triglycerides: 101 mg/dL (ref 0–149)
VLDL Cholesterol Cal: 19 mg/dL (ref 5–40)

## 2023-04-01 LAB — HIV ANTIBODY (ROUTINE TESTING W REFLEX): HIV Screen 4th Generation wRfx: NONREACTIVE

## 2023-04-01 LAB — VITAMIN D 25 HYDROXY (VIT D DEFICIENCY, FRACTURES): Vit D, 25-Hydroxy: 29 ng/mL — ABNORMAL LOW (ref 30.0–100.0)

## 2023-04-01 LAB — HCV AB W REFLEX TO QUANT PCR: HCV Ab: NONREACTIVE

## 2023-04-01 LAB — TSH+FREE T4
Free T4: 1.3 ng/dL (ref 0.82–1.77)
TSH: 2.07 u[IU]/mL (ref 0.450–4.500)

## 2023-04-01 LAB — B12 AND FOLATE PANEL
Folate: 2.6 ng/mL — ABNORMAL LOW (ref >3.0)
Vitamin B-12: >2000 pg/mL — ABNORMAL HIGH (ref 232–1245)

## 2023-04-01 LAB — HEMOGLOBIN A1C
Est. average glucose Bld gHb Est-mCnc: 105 mg/dL
Hgb A1c MFr Bld: 5.3 % (ref 4.8–5.6)

## 2023-04-01 LAB — HCV INTERPRETATION

## 2023-04-21 ENCOUNTER — Other Ambulatory Visit: Payer: Self-pay | Admitting: Internal Medicine

## 2023-04-21 DIAGNOSIS — J4521 Mild intermittent asthma with (acute) exacerbation: Secondary | ICD-10-CM

## 2023-05-07 ENCOUNTER — Ambulatory Visit (INDEPENDENT_AMBULATORY_CARE_PROVIDER_SITE_OTHER): Payer: Commercial Managed Care - PPO | Admitting: Internal Medicine

## 2023-05-07 ENCOUNTER — Encounter: Payer: Self-pay | Admitting: Internal Medicine

## 2023-05-07 VITALS — BP 152/100 | HR 94 | Ht 72.0 in | Wt 220.0 lb

## 2023-05-07 DIAGNOSIS — E538 Deficiency of other specified B group vitamins: Secondary | ICD-10-CM | POA: Diagnosis not present

## 2023-05-07 DIAGNOSIS — Z23 Encounter for immunization: Secondary | ICD-10-CM | POA: Diagnosis not present

## 2023-05-07 DIAGNOSIS — E782 Mixed hyperlipidemia: Secondary | ICD-10-CM

## 2023-05-07 DIAGNOSIS — I10 Essential (primary) hypertension: Secondary | ICD-10-CM | POA: Diagnosis not present

## 2023-05-07 DIAGNOSIS — E559 Vitamin D deficiency, unspecified: Secondary | ICD-10-CM | POA: Diagnosis not present

## 2023-05-07 DIAGNOSIS — Z1211 Encounter for screening for malignant neoplasm of colon: Secondary | ICD-10-CM | POA: Insufficient documentation

## 2023-05-07 MED ORDER — OLMESARTAN MEDOXOMIL 20 MG PO TABS: 20.0000 mg | ORAL_TABLET | Freq: Every day | ORAL | 2 refills | Status: AC

## 2023-05-07 MED ORDER — FOLIC ACID 1 MG PO TABS: 1.0000 mg | ORAL_TABLET | Freq: Every day | ORAL | 1 refills | Status: AC

## 2023-05-07 NOTE — Assessment & Plan Note (Signed)
Lipid panel updated last month.  Total cholesterol 175 and LDL 122.  His 10-year ASCVD risk is 6.7%.  We initially resumed atorvastatin, however he prefers to focus on dietary changes aimed at lowering his cholesterol for now. -Repeat lipid panel at follow-up in 6 months

## 2023-05-07 NOTE — Patient Instructions (Signed)
It was a pleasure to see you today.  Thank you for giving [place] the opportunity to be involved in your care.  Below is a brief recap of your visit and next steps.  We will plan to see you again in 3 months.  Summary Start olmesartan for hypertension Gastroenterology referral placed Nurse visit in month for BP check Follow up with me in 3 months

## 2023-05-07 NOTE — Assessment & Plan Note (Signed)
Noted on labs from last month.  Daily vitamin D supplementation recommended.

## 2023-05-07 NOTE — Assessment & Plan Note (Signed)
Zoster vaccine 1/2 administered today

## 2023-05-07 NOTE — Assessment & Plan Note (Signed)
Colonoscopy last completed in March 2020.  Repeat colonoscopy recommended for 3 years.  Gastroenterology referral placed today.

## 2023-05-07 NOTE — Progress Notes (Signed)
Established Patient Office Visit  Subjective   Patient ID: Jeffrey Lopez, male    DOB: December 21, 1970  Age: 52 y.o. MRN: [id number]  Chief Complaint  Patient presents with   Follow-up   Jeffrey Lopez returns to care today for routine follow-up.  He was last evaluated by me on 11/12 is a new patient presenting to establish care.  At that time his acute concern was a persistent cough x 3 weeks.  He was prescribed prednisone 20 mg x 5 days and an albuterol inhaler for symptom relief.  Protonix was also added due to report of symptoms worsening at night.  Basic labs were ordered and 4-week follow-up was arranged.  There have been no acute interval events. Jeffrey Lopez reports feeling well today.  His cough has completely resolved.  He is asymptomatic currently and has no additional concerns to discuss.  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   Hyperlipidemia    Nerve pain    Seasonal allergies    Past Surgical History:  Procedure Laterality Date   CERVICAL DISC SURGERY     Social History   Tobacco Use   Smoking status: Former    Current packs/day: 0.00    Average packs/day: 1 pack/day for 20.0 years (20.0 ttl pk-yrs)    Types: Cigarettes    Start date: 07/04/1993    Quit date: 07/04/2013    Years since quitting: 9.8   Smokeless tobacco: Former   Tobacco comments:    on the patch  Vaping Use   Vaping status: Never Used  Substance Use Topics   Alcohol use: Yes    Alcohol/week: 0.0 standard drinks of alcohol    Comment: Rarely   Drug use: No   Family History  Problem Relation Age of Onset   Asthma Mother        Deceased, 87   Heart attack Father        Deceased, 75   Diabetes Paternal Aunt    Healthy Sister    Healthy Daughter    Colon cancer Neg Hx    Stomach cancer Neg Hx    Pancreatic cancer Neg Hx    Esophageal cancer Neg Hx    Rectal cancer Neg Hx    No Known Allergies  Review of Systems   Constitutional:  Negative for chills and fever.  HENT:  Negative for sore throat.   Respiratory:  Negative for cough and shortness of breath.   Cardiovascular:  Negative for chest pain, palpitations and leg swelling.  Gastrointestinal:  Negative for abdominal pain, blood in stool, constipation, diarrhea, nausea and vomiting.  Genitourinary:  Negative for dysuria and hematuria.  Musculoskeletal:  Negative for myalgias.  Skin:  Negative for itching and rash.  Neurological:  Negative for dizziness and headaches.  Psychiatric/Behavioral:  Negative for depression and suicidal ideas.      Objective:     BP (!) 152/100   Pulse 94   Ht 6' (1.829 m)   Wt 220 lb (99.8 kg)   SpO2 97%   BMI 29.84 kg/m  BP Readings from Last 3 Encounters:  05/07/23 (!) 152/100  03/30/23 (!) 157/103  10/28/18 128/89   Physical Exam Vitals reviewed.  Constitutional:      General: He is not in acute distress.    Appearance: Normal appearance. He is not ill-appearing.  HENT:     Head: Normocephalic and atraumatic.  Right Ear: External ear normal.     Left Ear: External ear normal.     Nose: Nose normal. No congestion or rhinorrhea.     Mouth/Throat:     Mouth: Mucous membranes are moist.     Pharynx: Oropharynx is clear.  Eyes:     General: No scleral icterus.    Extraocular Movements: Extraocular movements intact.     Conjunctiva/sclera: Conjunctivae normal.     Pupils: Pupils are equal, round, and reactive to light.  Cardiovascular:     Rate and Rhythm: Normal rate and regular rhythm.     Pulses: Normal pulses.     Heart sounds: Normal heart sounds. No murmur heard. Pulmonary:     Effort: Pulmonary effort is normal.     Breath sounds: Normal breath sounds. No wheezing, rhonchi or rales.  Abdominal:     General: Abdomen is flat. Bowel sounds are normal. There is no distension.     Palpations: Abdomen is soft.     Tenderness: There is no abdominal tenderness.  Musculoskeletal:         General: No swelling or deformity. Normal range of motion.     Cervical back: Normal range of motion.     Comments: Ectrodactyly bilaterally  Skin:    General: Skin is warm and dry.     Capillary Refill: Capillary refill takes less than 2 seconds.  Neurological:     General: No focal deficit present.     Mental Status: He is alert and oriented to person, place, and time.     Motor: No weakness.  Psychiatric:        Mood and Affect: Mood normal.        Behavior: Behavior normal.        Thought Content: Thought content normal.   Last CBC Lab Results  Component Value Date   WBC 13.6 (H) 03/30/2023   HGB 17.9 (H) 03/30/2023   HCT 53.5 (H) 03/30/2023   MCV 90 03/30/2023   MCH 30.0 03/30/2023   RDW 12.6 03/30/2023   PLT 322 03/30/2023   Last metabolic panel Lab Results  Component Value Date   GLUCOSE 70 03/30/2023   NA 141 03/30/2023   K 4.7 03/30/2023   CL 101 03/30/2023   CO2 24 03/30/2023   BUN 7 03/30/2023   CREATININE 0.98 03/30/2023   EGFR 93 03/30/2023   CALCIUM 8.8 03/30/2023   PROT 6.6 03/30/2023   ALBUMIN 4.0 03/30/2023   LABGLOB 2.6 03/30/2023   AGRATIO 1.8 10/28/2018   BILITOT 0.3 03/30/2023   ALKPHOS 106 03/30/2023   AST 14 03/30/2023   ALT 18 03/30/2023   Last lipids Lab Results  Component Value Date   CHOL 175 03/30/2023   HDL 34 (L) 03/30/2023   LDLCALC 122 (H) 03/30/2023   TRIG 101 03/30/2023   CHOLHDL 5.1 (H) 03/30/2023   Last hemoglobin A1c Lab Results  Component Value Date   HGBA1C 5.3 03/30/2023   Last thyroid functions Lab Results  Component Value Date   TSH 2.070 03/30/2023   Last vitamin D Lab Results  Component Value Date   VD25OH 29.0 (L) 03/30/2023   Last vitamin B12 and Folate Lab Results  Component Value Date   VITAMINB12 >2000 (H) 03/30/2023   FOLATE 2.6 (L) 03/30/2023   The 10-year ASCVD risk score (Arnett DK, et al., 2019) is: 7.4%    Assessment & Plan:   Problem List Items Addressed This Visit        Essential  hypertension   New diagnosis.  Meeting criteria as his blood pressure remains elevated again today.  Olmesartan 20 mg daily has been started.  Nurse visit in 1 month for BP check and repeat BMP.      Hyperlipidemia   Lipid panel updated last month.  Total cholesterol 175 and LDL 122.  His 10-year ASCVD risk is 6.7%.  We initially resumed atorvastatin, however he prefers to focus on dietary changes aimed at lowering his cholesterol for now. -Repeat lipid panel at follow-up in 6 months      Colon cancer screening   Colonoscopy last completed in March 2020.  Repeat colonoscopy recommended for 3 years.  Gastroenterology referral placed today.      Need for Tdap vaccination - Primary   Tdap vaccine administered today      Need for zoster vaccination   Zoster vaccine 1/2 administered today      Folate deficiency   Noted on labs from last month.  Daily folic acid supplementation prescribed today.      Vitamin D insufficiency   Noted on labs from last month.  Daily vitamin D supplementation recommended.      Return in about 3 months (around 08/05/2023).   Billie Lade, MD

## 2023-05-07 NOTE — Assessment & Plan Note (Signed)
New diagnosis.  Meeting criteria as his blood pressure remains elevated again today.  Olmesartan 20 mg daily has been started.  Nurse visit in 1 month for BP check and repeat BMP.

## 2023-05-07 NOTE — Assessment & Plan Note (Signed)
-   Tdap vaccine administered today.

## 2023-05-07 NOTE — Assessment & Plan Note (Signed)
Noted on labs from last month.  Daily folic acid supplementation prescribed today.

## 2023-05-10 ENCOUNTER — Encounter (INDEPENDENT_AMBULATORY_CARE_PROVIDER_SITE_OTHER): Payer: Self-pay | Admitting: *Deleted

## 2023-06-07 ENCOUNTER — Ambulatory Visit: Payer: Commercial Managed Care - PPO

## 2023-06-25 ENCOUNTER — Ambulatory Visit: Payer: Commercial Managed Care - PPO

## 2023-06-28 ENCOUNTER — Ambulatory Visit: Payer: Commercial Managed Care - PPO

## 2023-08-06 ENCOUNTER — Ambulatory Visit: Payer: Self-pay | Admitting: Internal Medicine

## 2023-08-11 ENCOUNTER — Encounter: Payer: Self-pay | Admitting: Internal Medicine
